# Patient Record
Sex: Male | Born: 1975 | Hispanic: No | Marital: Married | Smoking: Never smoker
Health system: Southern US, Community
[De-identification: ages and names within clinical notes are randomized; demographics above are authoritative.]

## PROBLEM LIST (undated history)

## (undated) DIAGNOSIS — E785 Hyperlipidemia, unspecified: Secondary | ICD-10-CM

## (undated) HISTORY — DX: Hyperlipidemia, unspecified: E78.5

---
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
## 2020-01-29 ENCOUNTER — Ambulatory Visit: Payer: Self-pay | Admitting: Family Medicine

## 2020-02-19 ENCOUNTER — Ambulatory Visit: Payer: Self-pay | Admitting: Family Medicine

## 2020-03-10 ENCOUNTER — Other Ambulatory Visit: Payer: Self-pay

## 2020-03-10 ENCOUNTER — Encounter: Payer: Self-pay | Admitting: Family Medicine

## 2020-03-10 ENCOUNTER — Ambulatory Visit: Payer: PRIVATE HEALTH INSURANCE | Admitting: Family Medicine

## 2020-03-10 VITALS — BP 118/70 | HR 75 | Temp 98.3°F | Ht 69.0 in | Wt 216.1 lb

## 2020-03-10 DIAGNOSIS — E785 Hyperlipidemia, unspecified: Secondary | ICD-10-CM

## 2020-03-10 DIAGNOSIS — E876 Hypokalemia: Secondary | ICD-10-CM | POA: Diagnosis not present

## 2020-03-10 DIAGNOSIS — M79605 Pain in left leg: Secondary | ICD-10-CM

## 2020-03-10 LAB — LIPID PANEL
Cholesterol: 167 mg/dL (ref 0–200)
HDL: 31.4 mg/dL — ABNORMAL LOW (ref >39.00)
LDL Cholesterol: 108 mg/dL — ABNORMAL HIGH (ref 0–99)
NonHDL: 135.74
Total CHOL/HDL Ratio: 5
Triglycerides: 137 mg/dL (ref 0.0–149.0)
VLDL: 27.4 mg/dL (ref 0.0–40.0)

## 2020-03-10 LAB — BASIC METABOLIC PANEL
BUN: 15 mg/dL (ref 6–23)
CO2: 27 mEq/L (ref 19–32)
Calcium: 9.2 mg/dL (ref 8.4–10.5)
Chloride: 106 mEq/L (ref 96–112)
Creatinine, Ser: 1 mg/dL (ref 0.40–1.50)
GFR: 80.39 mL/min (ref >60.00)
Glucose, Bld: 102 mg/dL — ABNORMAL HIGH (ref 70–99)
Potassium: 3.6 mEq/L (ref 3.5–5.1)
Sodium: 139 mEq/L (ref 135–145)

## 2020-03-10 NOTE — Progress Notes (Signed)
Chief Complaint  Patient presents with   New Patient (Initial Visit)   Leg Pain    left       New Patient Visit SUBJECTIVE: HPI: Derek Potts is an 44 y.o.male who is being seen for establishing care.  LLE pain that gets worse throughout the day. Started 4-5 mo ago, getting worse/more freq. No inj or change in activity. No new footwear. No redness, swelling, bruising, decreased ROM. Sitting seems to make it worse. Located in back of thigh midway down, calf. Standing makes it better. Has not tried taking anything thus far. No numbness, tingling or weakness. Describes the pain as pins/needles.   K of 3.2 3 weeks ago with hematology team. Has not taken supps. Not on meds routinely. Asymptomatic.   Hyperlipidemia Patient presents for hyperlipidemia follow up. Currently being treated with Lipitor 10 mg/d and he has not been taking since running out. He denies myalgias. He is adhering to a healthy diet. Exercise: some walking, cycling The patient is not known to have coexisting coronary artery disease.  Past Medical History:  Diagnosis Date   Hyperlipidemia    History reviewed. No pertinent surgical history. Family History  Problem Relation Age of Onset   Hypertension Father    No Known Allergies  Current Outpatient Medications:    cholecalciferol (VITAMIN D3) 25 MCG (1000 UNIT) tablet, Take 1,000 Units by mouth daily., Disp: , Rfl:   OBJECTIVE: BP 118/70 (BP Location: Left Arm, Patient Position: Sitting, Cuff Size: Normal)    Pulse 75    Temp 98.3 F (36.8 C) (Oral)    Ht 5\' 9"  (1.753 m)    Wt (!) 216 lb 2 oz (98 kg)    SpO2 99%    BMI 31.92 kg/m  General:  well developed, well nourished, in no apparent distress Skin:  no significant moles, warts, or growths Nose:  nares patent, septum midline, mucosa normal Throat/Pharynx:  lips and gingiva without lesion; tongue and uvula midline; non-inflamed pharynx; no exudates or postnasal drainage Lungs:  clear to auscultation,  breath sounds equal bilaterally, no respiratory distress Cardio:  regular rate and rhythm, no LE edema or bruits Musculoskeletal: Knee exam on L neg; no ttp, mild tension on L when he stands on toes; no ttp over low back Neuro:  gait normal, DTR's equal and symmetric Psych: well oriented with normal range of affect and appropriate judgment/insight  ASSESSMENT/PLAN: Pain of left lower extremity  Hypokalemia - Plan: Basic metabolic panel  Hyperlipidemia, unspecified hyperlipidemia type - Plan: Lipid panel  I think this is likely positional. Try to change up sitting posture. His hamstrings and calves are tight, will give stretches/exercises. Heat. If no better in 1 mo, will try PT. 2- F/u on hypoK from heme team. 3- has not been taking Lipitor, will ck lipids today and follow up in 6 mo if he needs to go back on it. April for his CPE if he is doing well.  Patient should return pending above. The patient voiced understanding and agreement to the plan.   Derek Lafauci [place], DO 03/10/20  8:59 AM

## 2020-03-10 NOTE — Patient Instructions (Signed)
Give [place] 2-3 business days to get the results of your labs back.   Heat (pad or rice pillow in microwave) over affected area, 10-15 minutes twice daily.   Let me know in the next month if we aren't turning the corner with your left leg.   Semimembranosus Tendinitis Rehab  It is normal to feel mild stretching, pulling, tightness, or discomfort as you do these exercises, but you should stop right away if you feel sudden pain or your pain gets worse.  Stretching and range of motion exercises These exercises warm up your muscles and joints and improve the movement and flexibility of your thigh. These exercises also help to relieve pain, numbness, and tingling. Exercise A: Hamstring stretch, supine    1. Lie on your back. Loop a belt or towel across the ball of your left / right foot The ball of your foot is on the walking surface, right under your toes. 2. Straighten your left / right knee and slowly pull on the belt to raise your leg. Stop when you feel a gentle stretch behind your left / right knee or thigh. ? Do not allow the knee to bend. ? Keep your other leg flat on the floor. 3. Hold this position for 30 seconds. Repeat 2 times. Complete this exercise 3 times a week. Strengthening exercises These exercises build strength and endurance in your thigh. Endurance is the ability to use your muscles for a long time, even after they get tired. Exercise B: Straight leg raises (hip extensors) 1. Lie on your belly on a bed or a firm surface with a pillow under your hips. 2. Bend your left / right knee so your foot is straight up in the air. 3. Squeeze your buttock muscles and lift your left / right thigh off the bed. Do not let your back arch. 4. Hold this position for 3 seconds. 5. Slowly return to the starting position. Let your muscles relax completely before you do another repetition. Repeat 2 times. Complete this exercise 3 times a week. Exercise C: Bridge (hip extensors)     1. Lie on  your back on a firm surface with your knees bent and your feet flat on the floor. 2. Tighten your buttocks muscles and lift your bottom off the floor until your trunk is level with your thighs. ? You should feel the muscles working in your buttocks and the back of your thighs. If you do not feel these muscles, slide your feet 1-2 inches (2.5-5 cm) farther away from your buttocks. ? Do not arch your back. 3. Hold this position for 3 seconds. 4. Slowly lower your hips to the starting position. 5. Let your buttocks muscles relax completely between repetitions. If this exercise is too easy, try doing it with your arms crossed over your chest. Repeat 2 times. Complete this exercise 3 times a week. Exercise D: Hamstring eccentric, prone 1. Lie on your belly on a bed or on the floor. 2. Start with your legs straight. Cross your legs at the ankles with your left / right leg on top. 3. Using your bottom leg to do the work, bend both knees. 4. Using just your left / right leg alone, slowly lower your leg back down toward the bed. Add a 5 lb weight as told by your health care provider. 5. Let your muscles relax completely between repetitions. Repeat 2 times. Complete this exercise 3 times a week. Exercise E: Squats 1. Stand in front of a table,  with your feet and knees pointing straight ahead. You may rest your hands on the table for balance but not for support. 2. Slowly bend your knees and lower your hips like you are going to sit in a chair. Keep your thighs straight or pointed slightly outward. ? Keep your weight over your heels, not over your toes. ? Keep your lower legs upright so they are parallel with the table legs. ? Do not let your hips go lower than your knees. Stop when your knees are bent to the shape of an upside-down letter "L" (90 degree angle). ? Do not bend lower than told by your health care provider. ? If your knee pain increases, do not bend as low. 3. Hold the squat position 1-2  seconds. 4. Slowly push with your legs to return to standing. Do not use your hands to pull yourself to standing. Repeat 2 times. Complete this exercise 3 times a week. Make sure you discuss any questions you have with your health care provider. Document Released: 07/26/2005 Document Revised: 04/01/2016 Document Reviewed: 04/29/2015 Elsevier Interactive Patient Education  2018 [employer].  Stretching and range of motion exercises These exercises warm up your muscles and joints and improve the movement and flexibility of your lower leg. These exercises also help to relieve pain and stiffness.  Exercise A: Gastrocnemius stretch 1. Sit with your left / right leg extended. 2. Loop a belt or towel around the ball of your left / right foot. The ball of your foot is on the walking surface, right under your toes. 3. Hold both ends of the belt or towel. 4. Keep your left / right ankle and foot relaxed and keep your knee straight while you use the belt or towel to pull your foot and ankle toward you. Stop at the first point of resistance. 5. Hold this position for 30 seconds. Repeat 2 times. Complete this exercise 3 times per week.  Exercise B: Ankle alphabet 1. Sit with your left / right leg supported at the lower leg. ? Do not rest your foot on anything. ? Make sure your foot has room to move freely. 2. Think of your left / right foot as a paintbrush, and move your foot to trace each letter of the alphabet in the air. Keep your hip and knee still while you trace. 3. Trace every letter from A to Z. Repeat 2 times. Complete this exercise 3 times per week.  Strengthening exercises These exercises build strength and endurance in your lower leg. Endurance is the ability to use your muscles for a long time, even after they get tired.  Exercise C: Plantar flexors with band 1. Sit with your left / right leg extended. 2. Loop a rubber exercise band or tube around the ball of your left / right foot.  The ball of your foot is on the walking surface, right under your toes. 3. While holding both ends of the band or tube, slowly point your toes downward, pushing them away from you. 4. Hold this position for 3 seconds. 5. Slowly return your foot to the starting position and repeat for a total of 10 repetitions. Repeat 2 times. Complete this exercise 3 times per week.  Exercise D: Plantar flexors, standing 1. Stand with your feet shoulder-width apart. 2. Place your hands on a wall or table to steady yourself as needed, but try not to use it very much for support. 3. Rise up on your toes. 4. If this exercise is  too easy, try these options: ? Shift your weight toward your left / right leg until you feel challenged. ? If told by your health care provider, stand on your left / right foot only. 5. Hold this position for 3 seconds. 6. Repeat for a total of 10 repetitions. Repeat 2 times. Complete this exercise 3 times per week.  Exercise E: Plantar flexors, eccentric 1. Stand on the balls of your feet on the edge of a step. The ball of your foot is on the walking surface, right under your toes. 2. Place your hands on a wall or railing for balance as needed, but try not to lean on it for support. 3. Rise up on your toes, using both legs to help. 4. Slowly shift all of your weight to your left / right foot and lift your other foot off the step. 5. Slowly lower your left / right heel so it drops below the level of the step. You will feel a slight stretch in your left / right calf. 6. Put your other foot back onto the step. Repeat 2 times. Complete this exercise 3 times per week. This information is not intended to replace advice given to you by your health care provider. Make sure you discuss any questions you have with your health care provider.

## 2020-11-26 ENCOUNTER — Encounter: Payer: PRIVATE HEALTH INSURANCE | Admitting: Family Medicine

## 2020-12-22 ENCOUNTER — Ambulatory Visit (INDEPENDENT_AMBULATORY_CARE_PROVIDER_SITE_OTHER): Payer: PRIVATE HEALTH INSURANCE | Admitting: Family Medicine

## 2020-12-22 ENCOUNTER — Encounter: Payer: Self-pay | Admitting: Family Medicine

## 2020-12-22 ENCOUNTER — Other Ambulatory Visit: Payer: Self-pay

## 2020-12-22 VITALS — BP 138/86 | HR 78 | Temp 98.3°F | Ht 70.0 in | Wt 212.0 lb

## 2020-12-22 DIAGNOSIS — Z0000 Encounter for general adult medical examination without abnormal findings: Secondary | ICD-10-CM | POA: Diagnosis not present

## 2020-12-22 DIAGNOSIS — Z1159 Encounter for screening for other viral diseases: Secondary | ICD-10-CM

## 2020-12-22 DIAGNOSIS — Z23 Encounter for immunization: Secondary | ICD-10-CM | POA: Diagnosis not present

## 2020-12-22 DIAGNOSIS — M79605 Pain in left leg: Secondary | ICD-10-CM

## 2020-12-22 LAB — LIPID PANEL
Cholesterol: 175 mg/dL (ref 0–200)
HDL: 33.6 mg/dL — ABNORMAL LOW (ref >39.00)
LDL Cholesterol: 116 mg/dL — ABNORMAL HIGH (ref 0–99)
NonHDL: 141.36
Total CHOL/HDL Ratio: 5
Triglycerides: 127 mg/dL (ref 0.0–149.0)
VLDL: 25.4 mg/dL (ref 0.0–40.0)

## 2020-12-22 LAB — COMPREHENSIVE METABOLIC PANEL
ALT: 18 U/L (ref 0–53)
AST: 21 U/L (ref 0–37)
Albumin: 4.4 g/dL (ref 3.5–5.2)
Alkaline Phosphatase: 65 U/L (ref 39–117)
BUN: 14 mg/dL (ref 6–23)
CO2: 27 mEq/L (ref 19–32)
Calcium: 9.1 mg/dL (ref 8.4–10.5)
Chloride: 107 mEq/L (ref 96–112)
Creatinine, Ser: 0.95 mg/dL (ref 0.40–1.50)
GFR: 95.7 mL/min (ref >60.00)
Glucose, Bld: 97 mg/dL (ref 70–99)
Potassium: 3.7 mEq/L (ref 3.5–5.1)
Sodium: 141 mEq/L (ref 135–145)
Total Bilirubin: 0.8 mg/dL (ref 0.2–1.2)
Total Protein: 6.9 g/dL (ref 6.0–8.3)

## 2020-12-22 LAB — CBC
HCT: 40.2 % (ref 39.0–52.0)
Hemoglobin: 13.7 g/dL (ref 13.0–17.0)
MCHC: 34.1 g/dL (ref 30.0–36.0)
MCV: 88.4 fl (ref 78.0–100.0)
Platelets: 169 10*3/uL (ref 150.0–400.0)
RBC: 4.55 Mil/uL (ref 4.22–5.81)
RDW: 13.6 % (ref 11.5–15.5)
WBC: 3.8 10*3/uL — ABNORMAL LOW (ref 4.0–10.5)

## 2020-12-22 NOTE — Progress Notes (Signed)
Chief Complaint  Patient presents with  . Annual Exam    Cpe    [name] is here for a complete physical.   His last physical was >1 year ago.  Current diet: in general, a "healthy" diet.   Current exercise: walking, active in the yard Weight trend: stable Fatigue out of ordinary? No. Seat belt? Yes.   Loss of interested in doing things or depression in past 2 weeks? No  Health maintenance Tetanus- No HIV- Yes Hep C- No  Past Medical History:  Diagnosis Date  . Hyperlipidemia      History reviewed. No pertinent surgical history.  Medications  Current Outpatient Medications on File Prior to Visit  Medication Sig Dispense Refill  . cholecalciferol (VITAMIN D3) 25 MCG (1000 UNIT) tablet Take 1,000 Units by mouth daily.     Allergies No Known Allergies  Family History Family History  Problem Relation Age of Onset  . Hypertension Father     Review of Systems: Constitutional: no fevers or chills Eye:  no recent significant change in vision Ear/Nose/Mouth/Throat:  Ears:  no hearing loss Nose/Mouth/Throat:  no complaints of nasal congestion, no sore throat Cardiovascular:  no chest pain Respiratory:  no shortness of breath Gastrointestinal:  no abdominal pain, no change in bowel habits GU:  Male: negative for dysuria, frequency, and incontinence Musculoskeletal/Extremities:  no new pain of the joints Integumentary (Skin/Breast):  no abnormal skin lesions reported Neurologic:  no headaches Endocrine: No unexpected weight changes Hematologic/Lymphatic:  no night sweats  Exam BP 138/86   Pulse 78   Temp 98.3 F (36.8 C) (Oral)   Ht 5\' 10"  (1.778 m)   Wt 212 lb (96.2 kg)   SpO2 97%   BMI 30.42 kg/m  General:  well developed, well nourished, in no apparent distress Skin:  no significant moles, warts, or growths Head:  no masses, lesions, or tenderness Eyes:  pupils equal and round, sclera anicteric without injection Ears:  canals without lesions,  TMs shiny without retraction, no obvious effusion, no erythema Nose:  nares patent, septum midline, mucosa normal Throat/Pharynx:  lips and gingiva without lesion; tongue and uvula midline; non-inflamed pharynx; no exudates or postnasal drainage Neck: neck supple without adenopathy, thyromegaly, or masses Lungs:  clear to auscultation, breath sounds equal bilaterally, no respiratory distress Cardio:  regular rate and rhythm, no bruits, no LE edema Abdomen:  abdomen soft, nontender; bowel sounds normal; no masses or organomegaly Rectal: Deferred Musculoskeletal: +tightness of b/l hamstrings, worse on L; symmetrical muscle groups noted without atrophy or deformity Extremities:  no clubbing, cyanosis, or edema, no deformities, no skin discoloration Neuro:  gait normal; deep tendon reflexes normal and symmetric Psych: well oriented with normal range of affect and appropriate judgment/insight  Assessment and Plan  Well adult exam - Plan: CBC, Comprehensive metabolic panel, Lipid panel, Tdap vaccine greater than or equal to 7yo IM  Encounter for hepatitis C screening test for low risk patient - Plan: Hepatitis C antibody  Pain of left lower extremity - Plan: Ambulatory referral to Sports Medicine  Need for Tdap vaccination - Plan: Tdap vaccine greater than or equal to 7yo IM   Well 45 y.o. male. Counseled on diet and exercise. Counseled on risks and benefits of prostate cancer screening with PSA. The patient agrees to forego screening. He follows w urology.  Hamstring issue no better, will refer to sports med.  Tdap today.  Other orders as above. Follow up in 1 yr pending the above  workup. The patient voiced understanding and agreement to the plan.  Derek Potts [place], DO 12/22/20 8:35 AM

## 2020-12-22 NOTE — Patient Instructions (Signed)
Give us 2-3 business days to get the results of your labs back.   Keep the diet clean and stay active.  If you do not hear anything about your referral in the next 1-2 weeks, call our office and ask for an update.  Let us know if you need anything. 

## 2020-12-23 LAB — HEPATITIS C ANTIBODY
Hepatitis C Ab: NONREACTIVE
SIGNAL TO CUT-OFF: 0.01 (ref <1.00)

## 2021-01-06 ENCOUNTER — Ambulatory Visit: Payer: PRIVATE HEALTH INSURANCE | Admitting: Family Medicine

## 2021-01-06 ENCOUNTER — Encounter: Payer: Self-pay | Admitting: Family Medicine

## 2021-01-06 ENCOUNTER — Other Ambulatory Visit: Payer: Self-pay

## 2021-01-06 ENCOUNTER — Ambulatory Visit (HOSPITAL_BASED_OUTPATIENT_CLINIC_OR_DEPARTMENT_OTHER)
Admission: RE | Admit: 2021-01-06 | Discharge: 2021-01-06 | Disposition: A | Discharge status: Home / Self Care | Payer: PRIVATE HEALTH INSURANCE | Source: Ambulatory Visit | Attending: Family Medicine | Admitting: Family Medicine

## 2021-01-06 VITALS — BP 132/90 | Ht 70.0 in | Wt 212.0 lb

## 2021-01-06 DIAGNOSIS — M5416 Radiculopathy, lumbar region: Secondary | ICD-10-CM | POA: Insufficient documentation

## 2021-01-06 MED ORDER — PREDNISONE 5 MG PO TABS: ORAL_TABLET | ORAL | 0 refills | Status: DC

## 2021-01-06 NOTE — Progress Notes (Signed)
  Derek Potts - 45 y.o. male MRN [id number]  Date of birth: 1975-11-30  SUBJECTIVE:  Including CC & ROS.  No chief complaint on file.   Derek Potts is a 45 y.o. male that is presenting with left lateral leg pain is been ongoing for 1 year.  No injury or inciting event.  Feels like the pain is worsening.  Has not tried any medications.  No history of similar pain.   Review of Systems See HPI   HISTORY: Past Medical, Surgical, Social, and Family History Reviewed & Updated per EMR.   Pertinent Historical Findings include:  Past Medical History:  Diagnosis Date  . Hyperlipidemia     History reviewed. No pertinent surgical history.  Family History  Problem Relation Age of Onset  . Hypertension Father     Social History   Socioeconomic History  . Marital status: Married    Spouse name: Not on file  . Number of children: Not on file  . Years of education: Not on file  . Highest education level: Not on file  Occupational History  . Not on file  Tobacco Use  . Smoking status: Never Smoker  . Smokeless tobacco: Never Used  Substance and Sexual Activity  . Alcohol use: Never  . Drug use: Never  . Sexual activity: Not on file  Other Topics Concern  . Not on file  Social History Narrative  . Not on file   Social Determinants of Health   Financial Resource Strain: Not on file  Food Insecurity: Not on file  Transportation Needs: Not on file  Physical Activity: Not on file  Stress: Not on file  Social Connections: Not on file  Intimate Partner Violence: Not on file     PHYSICAL EXAM:  VS: BP 132/90 (BP Location: Left Arm, Patient Position: Sitting, Cuff Size: Large)   Ht 5\' 10"  (1.778 m)   Wt 212 lb (96.2 kg)   BMI 30.42 kg/m  Physical Exam Gen: NAD, alert, cooperative with exam, well-appearing MSK:  Left leg: Normal range of motion. Strength resistance. Instability with hip flexion and abduction on the left. Neurovascularly intact     ASSESSMENT & PLAN:    Lumbar radiculopathy Symptoms seem most consistent with radicular pain.  Has been ongoing for over a year.   -Counseled on home exercise therapy and supportive care. -X-ray. -Prednisone. -referral to physical therapy. -Could consider further imaging or gabapentin.

## 2021-01-06 NOTE — Assessment & Plan Note (Signed)
Symptoms seem most consistent with radicular pain.  Has been ongoing for over a year.   -Counseled on home exercise therapy and supportive care. -X-ray. -Prednisone. -referral to physical therapy. -Could consider further imaging or gabapentin.

## 2021-01-06 NOTE — Patient Instructions (Signed)
Nice to meet you Please try the exercises  I will call with the results.  Please send me a message in MyChart with any questions or updates.  Please see me back in 4 weeks.   --Dr. Manaal Mandala  

## 2021-01-08 ENCOUNTER — Telehealth: Payer: Self-pay | Admitting: Family Medicine

## 2021-01-08 NOTE — Telephone Encounter (Signed)
Informed of results.   Myra Rude, MD [facility] 01/08/2021, 8:19 AM

## 2021-02-04 ENCOUNTER — Ambulatory Visit: Payer: PRIVATE HEALTH INSURANCE | Admitting: Family Medicine

## 2021-03-05 ENCOUNTER — Ambulatory Visit: Payer: Self-pay | Admitting: Family Medicine

## 2021-06-15 ENCOUNTER — Encounter (HOSPITAL_BASED_OUTPATIENT_CLINIC_OR_DEPARTMENT_OTHER): Payer: Self-pay | Admitting: Physical Therapy

## 2021-06-15 ENCOUNTER — Other Ambulatory Visit: Payer: Self-pay

## 2021-06-15 ENCOUNTER — Encounter (HOSPITAL_BASED_OUTPATIENT_CLINIC_OR_DEPARTMENT_OTHER): Payer: No Typology Code available for payment source | Attending: Family Medicine | Admitting: Physical Therapy

## 2021-06-15 DIAGNOSIS — G8929 Other chronic pain: Secondary | ICD-10-CM | POA: Diagnosis present

## 2021-06-15 DIAGNOSIS — M5442 Lumbago with sciatica, left side: Secondary | ICD-10-CM | POA: Diagnosis present

## 2021-06-15 DIAGNOSIS — M5416 Radiculopathy, lumbar region: Secondary | ICD-10-CM | POA: Diagnosis not present

## 2021-06-15 NOTE — Therapy (Signed)
OUTPATIENT PHYSICAL THERAPY THORACOLUMBAR EVALUATION   Patient Name: Derek Potts MRN: [id number] DOB:03/07/1976, 45 y.o., male Today's Date: 06/16/2021   PT End of Session - 06/15/21 0858     Visit Number 1    Number of Visits 12    Date for PT Re-Evaluation 07/27/21    Authorization Type medcost    PT Start Time 0845    PT Stop Time 0928    PT Time Calculation (min) 43 min    Activity Tolerance Patient tolerated treatment well    Behavior During Therapy [facility] for tasks assessed/performed             Past Medical History:  Diagnosis Date   Hyperlipidemia    History reviewed. No pertinent surgical history. Patient Active Problem List   Diagnosis Date Noted   Lumbar radiculopathy 01/06/2021    PCP: Sharlene Dory, DO  REFERRING PROVIDER: Myra Rude, MD  REFERRING DIAG: M54.16 (ICD-10-CM) - Lumbar radiculopathy  THERAPY DIAG:  M54.16 (ICD-10-CM) - Lumbar radiculopathy  ONSET DATE: 1 year but increased pain over the past few months  SUBJECTIVE:                                                                                                                                                                                           SUBJECTIVE STATEMENT: Patient had an insidious onset of low back pain beginning about a year ago. It has gotten worse over the past few months. He has pain down his left leg. It is worst at night and in the morning. He had no past history of lower back pain.  PERTINENT HISTORY:  Nothing significant   PAIN:  Are you having pain? Yes VAS scale: 8/10  at worst.  Pain location: left low back and left sciatic pain  Pain orientation: Left  PAIN TYPE: aching Pain description: constant  Aggravating factors: sitting; night time Relieving factors: rest   PRECAUTIONS: None  WEIGHT BEARING RESTRICTIONS No   FALLS:  Has patient fallen in last 6 months? 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   PLOF: Independent  Hobbies: cycling   PATIENT GOALS   To have less pain    OBJECTIVE:   DIAGNOSTIC FINDINGS:  MRI S1 Nerve root impingement   PATIENT SURVEYS:  FOTO    SCREENING FOR RED FLAGS: Bowel or bladder incontinence: No Spinal tumors: No Cauda equina syndrome: No Compression fracture: No Abdominal aneurysm: No  COGNITION:  Overall cognitive status: Within functional limits for tasks assessed     SENSATION:  Light touch:  Appears intact  Stereognosis: Appears intact  Hot/Cold: Appears intact  Proprioception: Appears intact Pain radiates down the left leg into the left calf   MUSCLE LENGTH: Limited hamstring length bilateral  POSTURE:  Good posture   LUMBARAROM/PROM  A/PROM A/PROM  06/16/2021  Flexion 30 with pain   Extension Full    Right lateral flexion   Left lateral flexion Left side glide painful    Right rotation WNL  Left rotation WNL   (Blank rows = not tested)  LE AROM/PROM:  Pain with left hip flexion    (Blank rows = not tested)  LE MMT:  MMT Right 06/16/2021 Left 06/16/2021  Hip flexion 25 26.5  Hip extension    Hip abduction 31 35  Hip adduction    Hip internal rotation    Hip external rotation    Knee flexion    Knee extension 45.1 30  Ankle dorsiflexion    Ankle plantarflexion    Ankle inversion    Ankle eversion     (Blank rows = not tested)  LUMBAR SPECIAL TESTS:  Slump test not perfroemd 2nd to patient already having radicular symptoms.   SPINAL SEGMENTAL MOBILITY ASSESSMENT:  Limited L4-L5 PA mobility   Palapation :  Spasming of the lower left lumbar spine   GAIT: Decreased left hip flexion and decreased hip rotation with gait   TODAY'S TREATMENT  Reviewed anatomy of condition   Access Code: [facility] URL: [email or web address] Date: 06/15/2021 Prepared by: Lorayne Bender  Exercises Seated Hamstring Stretch - 1 x daily - 7 x weekly -  3 sets - 10 reps Static Prone on Elbows - 1 x daily - 7 x weekly - 3 sets - 10 reps Standing Glute Med Mobilization with Small Ball on Wall - 1 x daily - 7 x weekly - 1 reps - 1 min hold   PATIENT EDUCATION:  Education details: HEP, symptom management;  Person educated: Patient Education method: Explanation Education comprehension: verbalized understanding   HOME EXERCISE PROGRAM: Access Code: [email or web address] URL: [email or web address] Date: 06/15/2021 Prepared by: Lorayne Bender  Exercises Seated Hamstring Stretch - 1 x daily - 7 x weekly - 3 sets - 10 reps Static Prone on Elbows - 1 x daily - 7 x weekly - 3 sets - 10 reps Standing Glute Med Mobilization with Small Ball on Wall - 1 x daily - 7 x weekly - 1 reps - 1 min hold   ASSESSMENT:  CLINICAL IMPRESSION: Patient is a  45 year old ale with an insidious onset of lower back pain and left radicular pain. Signs and symptoms as well as MRI are consistent with lumbar disc bulge. His MRI shows a S1 nerve root impingement. He has radicular pain into his posterior left leg and into the calf. He has a significant limitation in lumbar flexion. He has pain with left hip flexion. He has the most pain when he is sitting. He would benefit from skilled therapy to reduce inflammation and compression on his nerve, and to allow him to get back to cycling and sitting for meetings at work.  decreased activity tolerance, decreased mobility, decreased ROM, decreased strength, impaired sensation, and pain  cleaning, community activity, driving, meal prep, and yard work  Profession  REHAB POTENTIAL: Excellent  CLINICAL DECISION MAKING: Evolving/moderate complexity increasing radicular symptoms   EVALUATION COMPLEXITY: Moderate   GOALS: Goals reviewed with patient? Yes  SHORT TERM GOALS:  STG Name Target Date Goal status  1 Patient will increase lumbar flexion by 20  degrees without radicular pain  Baseline:  07/07/2021 INITIAL  2  Patient will increase left knee extension strength by 10 lbs  Baseline:  07/07/2021 INITIAL  3 Patient will report centralized lower back pain with a 75% recution in radicular symptoms down his leg  Baseline: 07/07/2021 INITIAL  LONG TERM GOALS:   LTG Name Target Date Goal status  1 Patient will sit for > 2 hours at work without increased pain in order to go to meetings at work Baseline: 07/28/2021 INITIAL  2 Patient will be independent with a complete program for strengthening and stretching  Baseline: 07/28/2021 INITIAL  3 Patient will return to cycling without increased pain  Baseline: 07/28/2021 INITIAL  PLAN: PT FREQUENCY: 2x/week  PT DURATION: 6 weeks  PLANNED INTERVENTIONS: Therapeutic exercises, Therapeutic activity, Neuro Muscular re-education, Balance training, Gait training, Patient/Family education, Joint mobilization, Aquatic Therapy, Dry Needling, Electrical stimulation, Spinal mobilization, Cryotherapy, Moist heat, Taping, Traction, Ultrasound, and Manual therapy  PLAN FOR NEXT SESSION: consider LAD; assess hip mobility; work on [employer] progression; manual therapy to lumbar spine; begin basic core exercises.    Dessie Coma PT DPT  06/16/2021, 1:08 PM

## 2021-06-18 ENCOUNTER — Ambulatory Visit (HOSPITAL_BASED_OUTPATIENT_CLINIC_OR_DEPARTMENT_OTHER): Payer: No Typology Code available for payment source | Admitting: Physical Therapy

## 2021-06-23 ENCOUNTER — Ambulatory Visit (HOSPITAL_BASED_OUTPATIENT_CLINIC_OR_DEPARTMENT_OTHER): Payer: No Typology Code available for payment source | Attending: Family Medicine | Admitting: Physical Therapy

## 2021-06-23 ENCOUNTER — Encounter (HOSPITAL_BASED_OUTPATIENT_CLINIC_OR_DEPARTMENT_OTHER): Payer: Self-pay | Admitting: Physical Therapy

## 2021-06-23 ENCOUNTER — Other Ambulatory Visit: Payer: Self-pay

## 2021-06-23 DIAGNOSIS — G8929 Other chronic pain: Secondary | ICD-10-CM | POA: Insufficient documentation

## 2021-06-23 DIAGNOSIS — M5442 Lumbago with sciatica, left side: Secondary | ICD-10-CM | POA: Diagnosis present

## 2021-06-23 DIAGNOSIS — M5416 Radiculopathy, lumbar region: Secondary | ICD-10-CM | POA: Insufficient documentation

## 2021-06-23 NOTE — Therapy (Signed)
OUTPATIENT PHYSICAL THERAPY TREATMENT NOTE   Patient Name: Derek Potts MRN: [id number] DOB:[birth year], 45 y.o., male Today's Date: 06/23/2021  PCP: Sharlene Dory, DO REFERRING PROVIDER: Myra Rude, MD   PT End of Session - 06/23/21 1409     Visit Number 2    Number of Visits 12    Date for PT Re-Evaluation 07/27/21    Authorization Type medcost    PT Start Time 0845    PT Stop Time 0927    PT Time Calculation (min) 42 min    Activity Tolerance Patient tolerated treatment well    Behavior During Therapy [facility] for tasks assessed/performed             Past Medical History:  Diagnosis Date   Hyperlipidemia    History reviewed. No pertinent surgical history. Patient Active Problem List   Diagnosis Date Noted   Lumbar radiculopathy 01/06/2021   PCP: Sharlene Dory, DO   REFERRING PROVIDER: Myra Rude, MD   REFERRING DIAG: [phone] (ICD-10-CM) - Lumbar radiculopathy   THERAPY DIAG:  M54.16 (ICD-10-CM) - Lumbar radiculopathy   ONSET DATE: 1 year but increased pain over the past few months   SUBJECTIVE:                                                                                                                                                                                            SUBJECTIVE STATEMENT: Patient reports the pain in his back has improved but he continues to have significant pain in his calf and his foot. He feels the prone exercises have helped. He has been able to do the hamstring stretch with his left but with the right he has increased symptoms.   PERTINENT HISTORY:  Nothing significant    PAIN:  Are you having pain? Yes VAS scale: 8/10  at worst. Pain on average lower but still reaching high levels in the morning  Pain location: left low back and left sciatic pain  Pain orientation: Left  PAIN TYPE: aching Pain description: constant  Aggravating factors: sitting; night time Relieving factors: rest    TODAY'S  TREATMENT  Reviewed anatomy of condition       Exercises  Static Prone on Elbows 1 min  Prone press-up: able to go through the full range 3x10; improved pain in his foot and calf noted  Supine clamshell with abdominal breathing 3x10 green  Standing shoulder extension green 3x10 Scap retraction 3x10 green  Standing extension with self stabilization 3x10   Manual: trigger point release to lower lumbar spine and upper gluteal focused on the left: LAD grade III and IV  occialtions bilateral.           PATIENT EDUCATION:  Education details: HEP, symptom management;  Person educated: Patient Education method: Explanation Education comprehension: verbalized understanding     HOME EXERCISE PROGRAM: Access Code: [email or web address] URL: [email or web address] Date: 06/15/2021 Prepared by: Lorayne Bender   Exercises Seated Hamstring Stretch - 1 x daily - 7 x weekly - 3 sets - 10 reps Static Prone on Elbows - 1 x daily - 7 x weekly - 3 sets - 10 reps Standing Glute Med Mobilization with Small Ball on Wall - 1 x daily - 7 x weekly - 1 reps - 1 min hold     ASSESSMENT:   CLINICAL IMPRESSION: Patient is making some forward progress. His low back pain is improving. We would like for the foot and calf pain to be improving first though.  He does feel like he can control it more, but it is just as severe in the morning. He will be going to [place] on Thursday. He was advised to use his extension and stretches. Therapy added baseline core strengthening today. He had no increase in symptoms. We will continue to progress as tolerated.  cleaning, community activity, driving, meal prep, and yard work   Profession   REHAB POTENTIAL: Excellent   CLINICAL DECISION MAKING: Evolving/moderate complexity increasing radicular symptoms    EVALUATION COMPLEXITY: Moderate     GOALS: Goals reviewed with patient? Yes   SHORT TERM GOALS:   STG Name Target Date Goal status  1 Patient will  increase lumbar flexion by 20 degrees without radicular pain  Baseline:  07/07/2021 INITIAL  2 Patient will increase left knee extension strength by 10 lbs  Baseline:  07/07/2021 INITIAL  3 Patient will report centralized lower back pain with a 75% recution in radicular symptoms down his leg  Baseline: 07/07/2021 INITIAL  LONG TERM GOALS:    LTG Name Target Date Goal status  1 Patient will sit for > 2 hours at work without increased pain in order to go to meetings at work Baseline: 07/28/2021 INITIAL  2 Patient will be independent with a complete program for strengthening and stretching  Baseline: 07/28/2021 INITIAL  3 Patient will return to cycling without increased pain  Baseline: 07/28/2021 INITIAL  PLAN: PT FREQUENCY: 2x/week   PT DURATION: 6 weeks   PLANNED INTERVENTIONS: Therapeutic exercises, Therapeutic activity, Neuro Muscular re-education, Balance training, Gait training, Patient/Family education, Joint mobilization, Aquatic Therapy, Dry Needling, Electrical stimulation, Spinal mobilization, Cryotherapy, Moist heat, Taping, Traction, Ultrasound, and Manual therapy   PLAN FOR NEXT SESSION: consider LAD; assess hip mobility; work on [employer] progression; manual therapy to lumbar spine; begin basic core exercises.       Dessie Coma PT DPT  06/23/2021, 2:11 PM

## 2021-06-30 ENCOUNTER — Ambulatory Visit (HOSPITAL_BASED_OUTPATIENT_CLINIC_OR_DEPARTMENT_OTHER): Payer: No Typology Code available for payment source | Admitting: Physical Therapy

## 2021-06-30 ENCOUNTER — Other Ambulatory Visit: Payer: Self-pay

## 2021-06-30 DIAGNOSIS — M5416 Radiculopathy, lumbar region: Secondary | ICD-10-CM

## 2021-06-30 DIAGNOSIS — G8929 Other chronic pain: Secondary | ICD-10-CM

## 2021-06-30 NOTE — Therapy (Signed)
OUTPATIENT PHYSICAL THERAPY TREATMENT NOTE   Patient Name: Derek Potts MRN: [id number] DOB:April 07, 1976, 45 y.o., male Today's Date: 06/30/2021  PCP: Sharlene Dory, DO REFERRING PROVIDER: Sharlene Dory*    Past Medical History:  Diagnosis Date   Hyperlipidemia    No past surgical history on file. Patient Active Problem List   Diagnosis Date Noted   Lumbar radiculopathy 01/06/2021     PCP: Sharlene Dory, DO   REFERRING PROVIDER: Myra Rude, MD   REFERRING DIAG: [phone] (ICD-10-CM) - Lumbar radiculopathy   THERAPY DIAG:  M54.16 (ICD-10-CM) - Lumbar radiculopathy   ONSET DATE: 1 year but increased pain over the past few months   SUBJECTIVE:                                                                                                                                                                                            SUBJECTIVE STATEMENT: Patient feels like it is improving, but driving last week he had significant pain down his leg. He was able to make it abut 2.5 hours before he had to pull over and stretch. On the way back he was bale to do his stretches and exercises so the pain wasn't as bad.    PERTINENT HISTORY:  Nothing significant    PAIN:  Are you having pain? Yes VAS scale: 5/10  at worst. Pain on average lower but still reaching high levels with driving  Pain location: left low back and left sciatic pain  Pain orientation: Left  PAIN TYPE: aching Pain description: constant  Aggravating factors: sitting; night time Relieving factors: rest   radiates into the left lower leg.  TODAY'S TREATMENT  Reviewed anatomy of condition      11/22  Manual: trigger point release to lower lumbar spine and upper gluteal focused on the left: LAD grade III and IV occialtions bilateral.  Talked with patient about dry needling of the paraspinal. Patient will think about it.   Prone press-up: able to go through the full range 3x10;  i Shoulder extension machine 3x10 15 lbs  Row machine 3x10 with cuing for abdominal breathing 3x15  Triceps extension with abdominal brace 3x10 15 lbs   11/15  Exercises    Static Prone on Elbows 1 min  Prone press-up: able to go through the full range 3x10; improved pain in his foot and calf noted  Supine clamshell with abdominal breathing 3x10 green  Standing shoulder extension green 3x10 Scap retraction 3x10 green  Standing extension with self stabilization 3x10    Manual: trigger point release to lower lumbar spine and upper gluteal focused on the  left: LAD grade III and IV occialtions bilateral.                PATIENT EDUCATION:  Education details: HEP, symptom management;  Person educated: Patient Education method: Explanation Education comprehension: verbalized understanding     HOME EXERCISE PROGRAM: Access Code: [email or web address] URL: [email or web address] Date: 06/15/2021 Prepared by: Lorayne Bender   Exercises Seated Hamstring Stretch - 1 x daily - 7 x weekly - 3 sets - 10 reps Static Prone on Elbows - 1 x daily - 7 x weekly - 3 sets - 10 reps Standing Glute Med Mobilization with Small Ball on Wall - 1 x daily - 7 x weekly - 1 reps - 1 min hold     ASSESSMENT:   CLINICAL IMPRESSION: Patient continues to make progress. His pain appears to be centralizing. Most of his tightness was in his left lumbar parapsinal. He tolerated soft tissue mobilization well. Therapy advanced him to light cable work for core strengthening. He tolerated well but did report minor tightness with gym activity. He had 1 more visit scheduled. We will follow re-assess plan next visit.   Profession   REHAB POTENTIAL: Excellent   CLINICAL DECISION MAKING: Evolving/moderate complexity increasing radicular symptoms    EVALUATION COMPLEXITY: Moderate     GOALS: Goals reviewed with patient? Yes   SHORT TERM GOALS:   STG Name Target Date Goal status  1 Patient will increase  lumbar flexion by 20 degrees without radicular pain  Baseline:  07/07/2021 INITIAL  2 Patient will increase left knee extension strength by 10 lbs  Baseline:  07/07/2021 INITIAL  3 Patient will report centralized lower back pain with a 75% recution in radicular symptoms down his leg  Baseline: 07/07/2021 INITIAL  LONG TERM GOALS:    LTG Name Target Date Goal status  1 Patient will sit for > 2 hours at work without increased pain in order to go to meetings at work Baseline: 07/28/2021 INITIAL  2 Patient will be independent with a complete program for strengthening and stretching  Baseline: 07/28/2021 INITIAL  3 Patient will return to cycling without increased pain  Baseline: 07/28/2021 INITIAL  PLAN: PT FREQUENCY: 2x/week   PT DURATION: 6 weeks   PLANNED INTERVENTIONS: Therapeutic exercises, Therapeutic activity, Neuro Muscular re-education, Balance training, Gait training, Patient/Family education, Joint mobilization, Aquatic Therapy, Dry Needling, Electrical stimulation, Spinal mobilization, Cryotherapy, Moist heat, Taping, Traction, Ultrasound, and Manual therapy   PLAN FOR NEXT SESSION: consider LAD; assess hip mobility; work on [employer] progression; manual therapy to lumbar spine; begin basic core exercises.     Dessie Coma PT DPT  06/30/2021, 9:01 AM

## 2021-07-01 ENCOUNTER — Encounter (HOSPITAL_BASED_OUTPATIENT_CLINIC_OR_DEPARTMENT_OTHER): Payer: Self-pay | Admitting: Physical Therapy

## 2021-07-07 ENCOUNTER — Encounter (HOSPITAL_BASED_OUTPATIENT_CLINIC_OR_DEPARTMENT_OTHER): Payer: Self-pay | Admitting: Physical Therapy

## 2021-07-07 ENCOUNTER — Ambulatory Visit (HOSPITAL_BASED_OUTPATIENT_CLINIC_OR_DEPARTMENT_OTHER): Payer: No Typology Code available for payment source | Admitting: Physical Therapy

## 2021-07-07 ENCOUNTER — Other Ambulatory Visit: Payer: Self-pay

## 2021-07-07 DIAGNOSIS — M5416 Radiculopathy, lumbar region: Secondary | ICD-10-CM

## 2021-07-07 DIAGNOSIS — G8929 Other chronic pain: Secondary | ICD-10-CM

## 2021-07-07 NOTE — Therapy (Signed)
OUTPATIENT PHYSICAL THERAPY TREATMENT NOTE   Patient Name: Derek Potts MRN: [id number] DOB:12-Mar-1976, 45 y.o., male Today's Date: 07/07/2021  PCP: Sharlene Dory, DO REFERRING PROVIDER: Fortune, Brannigan*   PT End of Session - 07/07/21 0910     Visit Number 4    Number of Visits 12    Date for PT Re-Evaluation 07/27/21    Authorization Type medcost    PT Start Time 0847    PT Stop Time 0932    PT Time Calculation (min) 45 min    Activity Tolerance Patient tolerated treatment well    Behavior During Therapy [facility] for tasks assessed/performed             Past Medical History:  Diagnosis Date   Hyperlipidemia    History reviewed. No pertinent surgical history. Patient Active Problem List   Diagnosis Date Noted   Lumbar radiculopathy 01/06/2021   PCP: Sharlene Dory, DO   REFERRING PROVIDER: Myra Rude, MD   REFERRING DIAG: [phone] (ICD-10-CM) - Lumbar radiculopathy   THERAPY DIAG:  M54.16 (ICD-10-CM) - Lumbar radiculopathy   ONSET DATE: 1 year but increased pain over the past few months   SUBJECTIVE:                                                                                                                                                                                            SUBJECTIVE STATEMENT: Patient feels like it is improving, but driving last week he had significant pain down his leg. He was able to make it abut 2.5 hours before he had to pull over and stretch. On the way back he was bale to do his stretches and exercises so the pain wasn't as bad.    PERTINENT HISTORY:  Nothing significant    PAIN:  Are you having pain? Yes VAS scale: 5/10  at worst. Pain on average lower but still reaching high levels with driving  Pain location: left low back and left sciatic pain  Pain orientation: Left  PAIN TYPE: aching Pain description: constant  Aggravating factors: sitting; night time Relieving factors: rest   radiates  into the left lower leg.  TODAY'S TREATMENT  Reviewed anatomy of condition      11/29 Manual: trigger point release to lower lumbar spine and upper gluteal focused on the left: LAD grade III and IV occialtions bilateral.  Trigger point dry needling: consent given, explained procedure: skilled palpation of trigger points and monitoring of symptoms during treatment. Needles:     .30x50  Muscles Needled: Left L3 and L4 paraspinals   Prone press ups 2x15 Bridges 2x10 Supine  march 2x10  Seated clamshell 2x10 Rows: 15 lbs 2x10 Shoulder extension 2x10 10 lbs     11/22  Manual: trigger point release to lower lumbar spine and upper gluteal focused on the left: LAD grade III and IV occialtions bilateral.  Talked with patient about dry needling of the paraspinal. Patient will think about it.    Prone press-up: able to go through the full range 3x10; i Shoulder extension machine 3x10 15 lbs  Row machine 3x10 with cuing for abdominal breathing 3x15  Triceps extension with abdominal brace 3x10 15 lbs    11/15   Exercises     Static Prone on Elbows 1 min  Prone press-up: able to go through the full range 3x10; improved pain in his foot and calf noted  Supine clamshell with abdominal breathing 3x10 green  Standing shoulder extension green 3x10 Scap retraction 3x10 green  Standing extension with self stabilization 3x10    Manual: trigger point release to lower lumbar spine and upper gluteal focused on the left: LAD grade III and IV occialtions bilateral.                PATIENT EDUCATION:  Education details: HEP, symptom management;  Person educated: Patient Education method: Explanation Education comprehension: verbalized understanding     HOME EXERCISE PROGRAM: Access Code: [facility] URL: [email or web address] Date: 06/15/2021 Prepared by: Lorayne Bender   Exercises Seated Hamstring Stretch - 1 x daily - 7 x weekly - 3 sets - 10 reps Static Prone on Elbows -  1 x daily - 7 x weekly - 3 sets - 10 reps Standing Glute Med Mobilization with Small Ball on Wall - 1 x daily - 7 x weekly - 1 reps - 1 min hold     ASSESSMENT:   CLINICAL IMPRESSION: Therapy trialld dry needling today. He had a large trigger point around L3 L4. He had a good twitch response to the needling. Therapy then performed manual therapy to the area. He flet decreased pain with extensions after manual and needling. Therapy reviewed core exercises with the patient.  REHAB POTENTIAL: Excellent   CLINICAL DECISION MAKING: Evolving/moderate complexity increasing radicular symptoms    EVALUATION COMPLEXITY: Moderate     GOALS: Goals reviewed with patient? Yes   SHORT TERM GOALS:   STG Name Target Date Goal status  1 Patient will increase lumbar flexion by 20 degrees without radicular pain  Baseline:  07/07/2021 INITIAL  2 Patient will increase left knee extension strength by 10 lbs  Baseline:  07/07/2021 INITIAL  3 Patient will report centralized lower back pain with a 75% recution in radicular symptoms down his leg  Baseline: 07/07/2021 INITIAL  LONG TERM GOALS:    LTG Name Target Date Goal status  1 Patient will sit for > 2 hours at work without increased pain in order to go to meetings at work Baseline: 07/28/2021 INITIAL  2 Patient will be independent with a complete program for strengthening and stretching  Baseline: 07/28/2021 INITIAL  3 Patient will return to cycling without increased pain  Baseline: 07/28/2021 INITIAL  PLAN: PT FREQUENCY: 2x/week   PT DURATION: 6 weeks   PLANNED INTERVENTIONS: Therapeutic exercises, Therapeutic activity, Neuro Muscular re-education, Balance training, Gait training, Patient/Family education, Joint mobilization, Aquatic Therapy, Dry Needling, Electrical stimulation, Spinal mobilization, Cryotherapy, Moist heat, Taping, Traction, Ultrasound, and Manual therapy   PLAN FOR NEXT SESSION: consider LAD; assess hip mobility; work on  [employer] progression; manual therapy to lumbar spine; begin basic core exercises.  Dessie Coma PT DPT  07/07/2021, 11:41 AM

## 2021-07-24 ENCOUNTER — Other Ambulatory Visit: Payer: Self-pay

## 2021-07-24 ENCOUNTER — Encounter (HOSPITAL_BASED_OUTPATIENT_CLINIC_OR_DEPARTMENT_OTHER): Payer: Self-pay | Admitting: Physical Therapy

## 2021-07-24 ENCOUNTER — Ambulatory Visit (HOSPITAL_BASED_OUTPATIENT_CLINIC_OR_DEPARTMENT_OTHER): Payer: PRIVATE HEALTH INSURANCE | Attending: Family Medicine | Admitting: Physical Therapy

## 2021-07-24 DIAGNOSIS — G8929 Other chronic pain: Secondary | ICD-10-CM | POA: Diagnosis present

## 2021-07-24 DIAGNOSIS — M5416 Radiculopathy, lumbar region: Secondary | ICD-10-CM | POA: Diagnosis present

## 2021-07-24 DIAGNOSIS — M5442 Lumbago with sciatica, left side: Secondary | ICD-10-CM | POA: Insufficient documentation

## 2021-07-24 NOTE — Therapy (Signed)
OUTPATIENT PHYSICAL THERAPY TREATMENT NOTE   Patient Name: Derek Potts MRN: [id number] DOB:1976/06/24, 45 y.o., male Today's Date: 07/24/2021  PCP: Sharlene Dory, DO REFERRING PROVIDER: Elick, Aguilera*   PT End of Session - 07/24/21 0904     Visit Number 5    Number of Visits 12    Date for PT Re-Evaluation 07/27/21    Authorization Type medcost    PT Start Time 0845    PT Stop Time 0928    PT Time Calculation (min) 43 min    Activity Tolerance Patient tolerated treatment well    Behavior During Therapy [facility] for tasks assessed/performed             Past Medical History:  Diagnosis Date   Hyperlipidemia    History reviewed. No pertinent surgical history. Patient Active Problem List   Diagnosis Date Noted   Lumbar radiculopathy 01/06/2021    PCP: Sharlene Dory, DO   REFERRING PROVIDER: Myra Rude, MD   REFERRING DIAG: [phone] (ICD-10-CM) - Lumbar radiculopathy   THERAPY DIAG:  M54.16 (ICD-10-CM) - Lumbar radiculopathy   ONSET DATE: 1 year but increased pain over the past few months   SUBJECTIVE:                                                                                                                                                                                            SUBJECTIVE STATEMENT: Patient feels like it is improving, but driving last week he had significant pain down his leg. He was able to make it abut 2.5 hours before he had to pull over and stretch. On the way back he was bale to do his stretches and exercises so the pain wasn't as bad.    PERTINENT HISTORY:  Nothing significant    PAIN:  Are you having pain? Yes VAS scale: 5/10  when he woke up Pain location: left lower leg   Pain orientation: Left  PAIN TYPE: aching Pain description: constant  Aggravating factors: sitting; night time Relieving factors: rest   radiates into the left lower leg.  TODAY'S TREATMENT  Reviewed anatomy of condition    12/16 Prone press ups 2x15  Prone press ups 2x15 Bridges 2x10 Supine march 2x10  Seated clamshell 2x10 Rows: 15 lbs 2x10 Shoulder extension 2x10 10 lbs  Manual: trigger point release to lower lumbar spine and upper gluteal focused on the left: LAD grade III and IV occialtions bilateral.    11/29 Manual: trigger point release to lower lumbar spine and upper gluteal focused on the left: LAD grade III and IV occialtions bilateral.  Trigger point dry  needling: consent given, explained procedure: skilled palpation of trigger points and monitoring of symptoms during treatment. Needles:     .30x50  Muscles Needled: Left L3 and L4 paraspinals    Prone press ups 2x15 Bridges 2x10 Supine march 2x10  Seated clamshell 2x10 Rows: 15 lbs 2x10 Shoulder extension 2x10 10 lbs        11/22  Manual: trigger point release to lower lumbar spine and upper gluteal focused on the left: LAD grade III and IV occialtions bilateral.  Talked with patient about dry needling of the paraspinal. Patient will think about it.    Prone press-up: able to go through the full range 3x10; i Shoulder extension machine 3x10 15 lbs  Row machine 3x10 with cuing for abdominal breathing 3x15  Triceps extension with abdominal brace 3x10 15 lbs    11/15   Exercises     Static Prone on Elbows 1 min  Prone press-up: able to go through the full range 3x10; improved pain in his foot and calf noted  Supine clamshell with abdominal breathing 3x10 green  Standing shoulder extension green 3x10 Scap retraction 3x10 green  Standing extension with self stabilization 3x10    Manual: trigger point release to lower lumbar spine and upper gluteal focused on the left: LAD grade III and IV occialtions bilateral.                PATIENT EDUCATION:  Education details: HEP, symptom management;  Person educated: Patient Education method: Explanation Education comprehension: verbalized understanding     HOME EXERCISE  PROGRAM: Access Code: [facility] URL: [email or web address] Date: 06/15/2021 Prepared by: Lorayne Bender   Exercises Seated Hamstring Stretch - 1 x daily - 7 x weekly - 3 sets - 10 reps Static Prone on Elbows - 1 x daily - 7 x weekly - 3 sets - 10 reps Standing Glute Med Mobilization with Small Ball on Wall - 1 x daily - 7 x weekly - 1 reps - 1 min hold     ASSESSMENT:   CLINICAL IMPRESSION: At this time the patient was advised to go back to his MD for follow up. His pain is controllable but still reaches a high level in his lower leg in the morning. He can Fabiola Backer it out and for the most part has no pain during the day. The fact that it is still going to the bottom of his leg is concerning. He continues to have a spasm in his left lower lumbar paraspinal. Therapy performed manual therapy on the are and wokred on LAD for decompression. He will continue with PT until follow up.   REHAB POTENTIAL: Excellent   CLINICAL DECISION MAKING: Evolving/moderate complexity increasing radicular symptoms    EVALUATION COMPLEXITY: Moderate     GOALS: Goals reviewed with patient? Yes   SHORT TERM GOALS:   STG Name Target Date Goal status  1 Patient will increase lumbar flexion by 20 degrees without radicular pain  Baseline:  07/07/2021 INITIAL  2 Patient will increase left knee extension strength by 10 lbs  Baseline:  07/07/2021 INITIAL  3 Patient will report centralized lower back pain with a 75% recution in radicular symptoms down his leg  Baseline: 07/07/2021 INITIAL  LONG TERM GOALS:    LTG Name Target Date Goal status  1 Patient will sit for > 2 hours at work without increased pain in order to go to meetings at work Baseline: 07/28/2021 INITIAL  2 Patient will be independent with a complete program for strengthening and  stretching  Baseline: 07/28/2021 INITIAL  3 Patient will return to cycling without increased pain  Baseline: 07/28/2021 INITIAL  PLAN: PT FREQUENCY:  2x/week   PT DURATION: 6 weeks   PLANNED INTERVENTIONS: Therapeutic exercises, Therapeutic activity, Neuro Muscular re-education, Balance training, Gait training, Patient/Family education, Joint mobilization, Aquatic Therapy, Dry Needling, Electrical stimulation, Spinal mobilization, Cryotherapy, Moist heat, Taping, Traction, Ultrasound, and Manual therapy   PLAN FOR NEXT SESSION: consider LAD; assess hip mobility; work on [employer] progression; manual therapy to lumbar spine; begin basic core exercises.      Dessie Coma PT DPT  07/24/2021, 2:24 PM

## 2021-08-11 ENCOUNTER — Encounter (HOSPITAL_BASED_OUTPATIENT_CLINIC_OR_DEPARTMENT_OTHER): Payer: No Typology Code available for payment source | Admitting: Physical Therapy

## 2021-08-26 ENCOUNTER — Ambulatory Visit (HOSPITAL_BASED_OUTPATIENT_CLINIC_OR_DEPARTMENT_OTHER): Payer: PRIVATE HEALTH INSURANCE | Attending: Family Medicine | Admitting: Physical Therapy

## 2021-08-26 ENCOUNTER — Other Ambulatory Visit: Payer: Self-pay

## 2021-08-26 ENCOUNTER — Encounter (HOSPITAL_BASED_OUTPATIENT_CLINIC_OR_DEPARTMENT_OTHER): Payer: Self-pay | Admitting: Physical Therapy

## 2021-08-26 DIAGNOSIS — M5416 Radiculopathy, lumbar region: Secondary | ICD-10-CM | POA: Diagnosis present

## 2021-08-26 DIAGNOSIS — G8929 Other chronic pain: Secondary | ICD-10-CM | POA: Diagnosis present

## 2021-08-26 DIAGNOSIS — M5442 Lumbago with sciatica, left side: Secondary | ICD-10-CM | POA: Diagnosis present

## 2021-08-26 NOTE — Therapy (Signed)
OUTPATIENT PHYSICAL THERAPY TREATMENT NOTE   Patient Name: Derek Potts MRN: [id number] DOB:[birth year], 46 y.o., male Today's Date: 08/26/2021  PCP: Sharlene Dory, DO REFERRING PROVIDER: Varun, Jourdan*   PT End of Session - 08/26/21 0858     Visit Number 6    Number of Visits 12    Date for PT Re-Evaluation 07/27/21    Authorization Type medcost    PT Start Time 0845    PT Stop Time 0927    PT Time Calculation (min) 42 min    Activity Tolerance Patient tolerated treatment well    Behavior During Therapy [facility] for tasks assessed/performed             Past Medical History:  Diagnosis Date   Hyperlipidemia    History reviewed. No pertinent surgical history. Patient Active Problem List   Diagnosis Date Noted   Lumbar radiculopathy 01/06/2021    PCP: Sharlene Dory, DO   REFERRING PROVIDER: Myra Rude, MD   REFERRING DIAG: [phone] (ICD-10-CM) - Lumbar radiculopathy   THERAPY DIAG:  M54.16 (ICD-10-CM) - Lumbar radiculopathy   ONSET DATE: 1 year but increased pain over the past few months   SUBJECTIVE:                                                                                                                                                                                            SUBJECTIVE STATEMENT: Patient has almost no pain during the day/ He wakes up in the morning with significant pain going down the side of his hip and at times into his foot. The area he describes follows the L5 nerve root perfectly. He continues to do his exercises at home.  PERTINENT HISTORY:  Nothing significant    PAIN:  Are you having pain? Yes VAS scale: 5/10  when he woke up Pain location: left lower leg   Pain orientation: Left  PAIN TYPE: aching Pain description: constant  Aggravating factors: sitting; night time Relieving factors: rest   radiates into the left lower leg.  Objective   MMT Right 06/16/2021 Left 06/16/2021  Hip flexion  25 1/18 52.5 26.5 1/18 49    Hip extension      Hip abduction 31 1/18 59.5 35 1/18 65.5  Hip adduction      Hip internal rotation      Hip external rotation      Knee flexion      Knee extension 45.1 1/18 72.3 30 1/18 53.1  Ankle dorsiflexion      Ankle plantarflexion      Ankle inversion      Ankle  eversion       (Blank rows = not tested) FOTO:  47% expected 72% ability in 6 visits  Motion :  Flexion 70 degrees without pain  Full rotation bilateral  Improved pain with extension    TODAY'S TREATMENT  1/18 SLR 2x10 bilateral                Bridges 2x10 Proner press-up 2x10  Reviewed reuslts of tests and measures. Also reviewed L5 nerve root.  LAQ green band 3x15    Manual: trigger point release to lower lumbar spine and upper gluteal focused on the left: LAD grade III and IV occialtions bilateral.    Manual: trigger point release to lower lumbar spine and upper gluteal focused on the left: LAD grade III and IV occialtions bilateral.   12/16 Prone press ups 2x15  Prone press ups 2x15 Bridges 2x10 Supine march 2x10  Seated clamshell 2x10 Rows: 15 lbs 2x10 Shoulder extension 2x10 10 lbs  Manual: trigger point release to lower lumbar spine and upper gluteal focused on the left: LAD grade III and IV occialtions bilateral.    11/29 Manual: trigger point release to lower lumbar spine and upper gluteal focused on the left: LAD grade III and IV occialtions bilateral.  Trigger point dry needling: consent given, explained procedure: skilled palpation of trigger points and monitoring of symptoms during treatment. Needles:     .30x50  Muscles Needled: Left L3 and L4 paraspinals    Prone press ups 2x15 Bridges 2x10 Supine march 2x10  Seated clamshell 2x10 Rows: 15 lbs 2x10 Shoulder extension 2x10 10 lbs                      PATIENT EDUCATION:  Education details: HEP, symptom management;  Person educated: Patient Education method: Explanation Education  comprehension: verbalized understanding     HOME EXERCISE PROGRAM: Access Code: [email or web address] URL: [email or web address] Date: 06/15/2021 Prepared by: Lorayne Bender   Exercises Seated Hamstring Stretch - 1 x daily - 7 x weekly - 3 sets - 10 reps Static Prone on Elbows - 1 x daily - 7 x weekly - 3 sets - 10 reps Standing Glute Med Mobilization with Small Ball on Wall - 1 x daily - 7 x weekly - 1 reps - 1 min hold     ASSESSMENT:   CLINICAL IMPRESSION: Patient is making progress. His strength has improved although his knee extension is limited compared to the right. His FOTO has improved significantly. It is concerning though that he still has high levels of pain in the morning. He is able to work it out with his stretches and his exercises but it comes back with sitting and in bed at night. Therapy gave him exercises to focus on his quad weakness. SLR seemed to reproduce his pain. He was advised improving his ability to do this could improve his pain but if it makes it worse stop. The patient will come back in 2-3 weeks for a follow up. He was advised in the meantime to talk to is MD about a follow up.  REHAB POTENTIAL: Excellent   CLINICAL DECISION MAKING: Evolving/moderate complexity increasing radicular symptoms    EVALUATION COMPLEXITY: Moderate     GOALS: Goals reviewed with patient? Yes   SHORT TERM GOALS:   STG Name Target Date Goal status 1/18  1 Patient will increase lumbar flexion by 20 degrees without radicular pain  Baseline:  07/07/2021 Improved 30 degrees  Achieved   2 Patient will  increase left knee extension strength  to within 5 degrees of the right  07/07/2021 All measures improved by at least 20  Achieved and revised   3 Patient will report centralized lower back pain with a 75% recution in radicular symptoms down his leg  Baseline: 07/07/2021 Continues to have pain into his lower leg and foot. The instances of pain have reduced significantly but  intensity and area is about the same   LONG TERM GOALS:    LTG Name Target Date Goal status  1 Patient will sit for > 2 hours at work without increased pain in order to go to meetings at work Baseline: 07/28/2021 When he sits all day it becomes painful  Ongoing   2 Patient will be independent with a complete program for strengthening and stretching  Baseline: 07/28/2021 Still advancing exercises  3 Patient will return to cycling without increased pain  Baseline: 07/28/2021 Has not returned to cycling  Ongoing   PLAN: PT FREQUENCY: 2x/week   PT DURATION: 6 weeks   PLANNED INTERVENTIONS: Therapeutic exercises, Therapeutic activity, Neuro Muscular re-education, Balance training, Gait training, Patient/Family education, Joint mobilization, Aquatic Therapy, Dry Needling, Electrical stimulation, Spinal mobilization, Cryotherapy, Moist heat, Taping, Traction, Ultrasound, and Manual therapy   PLAN FOR NEXT SESSION: consider LAD; assess hip mobility; work on [employer] progression; manual therapy to lumbar spine; begin basic core exercises.      Dessie Comaavid J Ebon Ketchum PT DPT  08/26/2021, 4:16 PM

## 2021-09-22 ENCOUNTER — Encounter (HOSPITAL_BASED_OUTPATIENT_CLINIC_OR_DEPARTMENT_OTHER): Payer: PRIVATE HEALTH INSURANCE | Admitting: Physical Therapy

## 2021-09-29 ENCOUNTER — Encounter (HOSPITAL_BASED_OUTPATIENT_CLINIC_OR_DEPARTMENT_OTHER): Payer: Self-pay | Admitting: Physical Therapy

## 2021-09-29 ENCOUNTER — Other Ambulatory Visit: Payer: Self-pay

## 2021-09-29 ENCOUNTER — Ambulatory Visit (HOSPITAL_BASED_OUTPATIENT_CLINIC_OR_DEPARTMENT_OTHER): Payer: PRIVATE HEALTH INSURANCE | Attending: Family Medicine | Admitting: Physical Therapy

## 2021-09-29 DIAGNOSIS — M5416 Radiculopathy, lumbar region: Secondary | ICD-10-CM | POA: Insufficient documentation

## 2021-09-29 DIAGNOSIS — G8929 Other chronic pain: Secondary | ICD-10-CM | POA: Insufficient documentation

## 2021-09-29 DIAGNOSIS — M5442 Lumbago with sciatica, left side: Secondary | ICD-10-CM | POA: Insufficient documentation

## 2021-09-29 NOTE — Therapy (Signed)
OUTPATIENT PHYSICAL THERAPY TREATMENT NOTE   Patient Name: Derek Potts MRN: [id number] DOB:Sep 02, 1975, 46 y.o., male Today's Date: 09/29/2021  PCP: Sharlene Dory, DO REFERRING PROVIDER: Rhone, Wnorowski*   PT End of Session - 09/29/21 0849     Visit Number 7    Number of Visits 12    Date for PT Re-Evaluation 07/27/21    Authorization Type medcost    PT Start Time 0845    PT Stop Time 0926    PT Time Calculation (min) 41 min    Activity Tolerance Patient tolerated treatment well    Behavior During Therapy [facility] for tasks assessed/performed             Past Medical History:  Diagnosis Date   Hyperlipidemia    History reviewed. No pertinent surgical history. Patient Active Problem List   Diagnosis Date Noted   Lumbar radiculopathy 01/06/2021    PCP: Sharlene Dory, DO   REFERRING PROVIDER: Myra Rude, MD   REFERRING DIAG: M54.16 (ICD-10-CM) - Lumbar radiculopathy   THERAPY DIAG:  M54.16 (ICD-10-CM) - Lumbar radiculopathy   ONSET DATE: 1 year but increased pain over the past few months   SUBJECTIVE:                                                                                                                                                                                            SUBJECTIVE STATEMENT: The pain down his leg has resolved. He is really just having pain when he is bending over. He has some tnesion in his lower back  PERTINENT HISTORY:  Nothing significant    PAIN:  Are you having pain? no VAS scale: 0/10  when he woke up Pain location: left lower leg   Pain orientation: Left  PAIN TYPE: aching Pain description: constant  Aggravating factors: sitting; night time Relieving factors: rest   radiates into the left lower leg.  Objective   MMT Right 06/16/2021 Left 06/16/2021  Hip flexion 25 1/18 52.5 26.5 1/18 49    Hip extension      Hip abduction 31 1/18 59.5 35 1/18 65.5  Hip adduction      Hip  internal rotation      Hip external rotation      Knee flexion      Knee extension 45.1 1/18 72.3 30 1/18 53.1  Ankle dorsiflexion      Ankle plantarflexion      Ankle inversion      Ankle eversion       (Blank rows = not tested) FOTO:  47% expected 72% ability in 6 visits  Motion :  Flexion 70 degrees without pain  Full rotation bilateral  Improved pain with extension    TODAY'S TREATMENT  2/21 Manual:  Manual: trigger point release to lower lumbar spine and upper gluteal focused on the left: LAD grade III and IV occialtions bilateral.  Nerve gliding with ankle x5  Nerve gliding with head slump x5  Prayer stretch with hand hold for traction 3x20 sec hold      Bridges 2x10 with green band  Supne march x30  Supine green band clamshell x30   Standing shoulder extension x30 blue  Standing row x30 blue   1/18 SLR 2x10 bilateral                Bridges 2x10 Proner press-up 2x10  Reviewed reuslts of tests and measures. Also reviewed L5 nerve root.  LAQ green band 3x15    Manual: trigger point release to lower lumbar spine and upper gluteal focused on the left: LAD grade III and IV occialtions bilateral.    Manual: trigger point release to lower lumbar spine and upper gluteal focused on the left: LAD grade III and IV occialtions bilateral.   12/16 Prone press ups 2x15  Prone press ups 2x15 Bridges 2x10 Supine march 2x10  Seated clamshell 2x10 Rows: 15 lbs 2x10 Shoulder extension 2x10 10 lbs  Manual: trigger point release to lower lumbar spine and upper gluteal focused on the left: LAD grade III and IV occialtions bilateral.    11/29 Manual: trigger point release to lower lumbar spine and upper gluteal focused on the left: LAD grade III and IV occialtions bilateral.  Trigger point dry needling: consent given, explained procedure: skilled palpation of trigger points and monitoring of symptoms during treatment. Needles:     .30x50  Muscles Needled: Left L3 and L4  paraspinals    Prone press ups 2x15 Bridges 2x10 Supine march 2x10  Seated clamshell 2x10 Rows: 15 lbs 2x10 Shoulder extension 2x10 10 lbs                      PATIENT EDUCATION:  Education details: reviewed benefits and risks of nerve flossing  Person educated: Patient Education method: Explanation Education comprehension: verbalized understanding     HOME EXERCISE PROGRAM: Access Code: [email or web address] URL: [email or web address] Date: 06/15/2021 Prepared by: Lorayne Bender   Exercises Seated Hamstring Stretch - 1 x daily - 7 x weekly - 3 sets - 10 reps Static Prone on Elbows - 1 x daily - 7 x weekly - 3 sets - 10 reps Standing Glute Med Mobilization with Small Ball on Wall - 1 x daily - 7 x weekly - 1 reps - 1 min hold     ASSESSMENT:   CLINICAL IMPRESSION: Over the past few weeks the patients radicular pain has resolved. He has just had tension in his back when bending. Therapy reviewed nerve gliding to improve neural tension with bending. He was advised to perform these nerve glides just a few times a week. He had no increase in pain with this. We also reviewed prayer stretching. We reviewed base exercises to work on at home. He had mild tension in his lower lumbar spine with manual therapy. He had no pain with his exercises. If he does well next visit we will likely D/C to HEP   REHAB POTENTIAL: Excellent   CLINICAL DECISION MAKING: Evolving/moderate complexity increasing radicular symptoms    EVALUATION COMPLEXITY: Moderate     GOALS: Goals reviewed with patient? Yes  SHORT TERM GOALS:   STG Name Target Date Goal status 1/18  1 Patient will increase lumbar flexion by 20 degrees without radicular pain  Baseline:  07/07/2021 Improved 30 degrees  Achieved   2 Patient will increase left knee extension strength  to within 5 degrees of the right  07/07/2021 All measures improved by at least 20  Achieved and revised   3 Patient will report centralized  lower back pain with a 75% recution in radicular symptoms down his leg  Baseline: 07/07/2021 Continues to have pain into his lower leg and foot. The instances of pain have reduced significantly but intensity and area is about the same   LONG TERM GOALS:    LTG Name Target Date Goal status  1 Patient will sit for > 2 hours at work without increased pain in order to go to meetings at work Baseline: 07/28/2021 When he sits all day it becomes painful  Ongoing   2 Patient will be independent with a complete program for strengthening and stretching  Baseline: 07/28/2021 Still advancing exercises  3 Patient will return to cycling without increased pain  Baseline: 07/28/2021 Has not returned to cycling  Ongoing   PLAN: PT FREQUENCY: 2x/week   PT DURATION: 6 weeks   PLANNED INTERVENTIONS: Therapeutic exercises, Therapeutic activity, Neuro Muscular re-education, Balance training, Gait training, Patient/Family education, Joint mobilization, Aquatic Therapy, Dry Needling, Electrical stimulation, Spinal mobilization, Cryotherapy, Moist heat, Taping, Traction, Ultrasound, and Manual therapy   PLAN FOR NEXT SESSION: consider LAD; assess hip mobility; work on [employer] progression; manual therapy to lumbar spine; begin basic core exercises.      Dessie Coma PT DPT  09/29/2021, 9:40 AM

## 2021-10-06 ENCOUNTER — Ambulatory Visit (HOSPITAL_BASED_OUTPATIENT_CLINIC_OR_DEPARTMENT_OTHER): Payer: PRIVATE HEALTH INSURANCE | Admitting: Physical Therapy

## 2021-10-06 ENCOUNTER — Other Ambulatory Visit: Payer: Self-pay

## 2021-10-06 ENCOUNTER — Encounter (HOSPITAL_BASED_OUTPATIENT_CLINIC_OR_DEPARTMENT_OTHER): Payer: Self-pay | Admitting: Physical Therapy

## 2021-10-06 DIAGNOSIS — M5416 Radiculopathy, lumbar region: Secondary | ICD-10-CM

## 2021-10-06 DIAGNOSIS — G8929 Other chronic pain: Secondary | ICD-10-CM

## 2021-10-06 NOTE — Therapy (Signed)
OUTPATIENT PHYSICAL THERAPY TREATMENT NOTE   Patient Name: Derek Potts MRN: [id number] DOB:1976/06/18, 46 y.o., male Today's Date: 10/06/2021  PCP: Sharlene Dory, DO REFERRING PROVIDER: Earvin, Blazier*   PT End of Session - 10/06/21 0915     Visit Number 8    Number of Visits 12    Date for PT Re-Evaluation 11/17/21    Authorization Type medcost    PT Start Time 0845    PT Stop Time 0925    PT Time Calculation (min) 40 min    Activity Tolerance Patient tolerated treatment well    Behavior During Therapy [facility] for tasks assessed/performed              Past Medical History:  Diagnosis Date   Hyperlipidemia    History reviewed. No pertinent surgical history. Patient Active Problem List   Diagnosis Date Noted   Lumbar radiculopathy 01/06/2021    PCP: Sharlene Dory, DO   REFERRING PROVIDER: Myra Rude, MD   REFERRING DIAG: M54.16 (ICD-10-CM) - Lumbar radiculopathy   THERAPY DIAG:  M54.16 (ICD-10-CM) - Lumbar radiculopathy   ONSET DATE: 1 year but increased pain over the past few months   SUBJECTIVE:                                                                                                                                                                                            SUBJECTIVE STATEMENT: Patient has felt  quick twinges of pain down his lwg over the past few days. He continues to feel some tension in his back but it is better.   PERTINENT HISTORY:  Nothing significant    PAIN:  Are you having pain? no VAS scale: 0/10  when he woke up Pain location: left lower leg   Pain orientation: Left  PAIN TYPE: aching Pain description: constant  Aggravating factors: sitting; night time Relieving factors: rest    Objective   MMT Right 06/16/2021 Left 06/16/2021  Hip flexion 25 1/18 52.5 26.5 1/18 49      Hip extension      Hip abduction 31 1/18 59.5 35 1/18 65.5  Hip adduction      Hip internal  rotation      Hip external rotation      Knee flexion      Knee extension 45.1 1/18 72.3 30 1/18 53.1  Ankle dorsiflexion      Ankle plantarflexion      Ankle inversion      Ankle eversion       (Blank rows = not tested) FOTO:  47% expected 72% ability in 6 visits  Motion :  Flexion 70 degrees without pain  Full rotation bilateral  Improved pain with extension     TODAY'S TREATMENT  2/28 Manual: trigger point release to lower lumbar spine and upper gluteal focused on the left: LAD grade III and IV occialtions bilateral.    Nerve glide: Patient felt nerve glide with just a hamstring stretch.   Prone press up 2x20   Supine march with switch x30  Supine hip abdcution blue x30 ( could feel some tightening    Seated LAQ Blue band 2x20 with cuing for posture and breathing   Cable extension x30 15 lbs  [employer] x30 15 lbs   2/21 Manual:  Manual: trigger point release to lower lumbar spine and upper gluteal focused on the left: LAD grade III and IV occialtions bilateral.  Nerve gliding with ankle x5  Nerve gliding with head slump x5  Prayer stretch with hand hold for traction 3x20 sec hold      Bridges 2x10 with green band  Supne march x30  Supine green band clamshell x30   Standing shoulder extension x30 blue  Standing row x30 blue   1/18 SLR 2x10 bilateral                Bridges 2x10 Proner press-up 2x10  Reviewed reuslts of tests and measures. Also reviewed L5 nerve root.  LAQ green band 3x15    Manual: trigger point release to lower lumbar spine and upper gluteal focused on the left: LAD grade III and IV occialtions bilateral.    Manual: trigger point release to lower lumbar spine and upper gluteal focused on the left: LAD grade III and IV occialtions bilateral.   12/16 Prone press ups 2x15  Prone press ups 2x15 Bridges 2x10 Supine march 2x10  Seated clamshell 2x10 Rows: 15 lbs 2x10 Shoulder extension 2x10 10 lbs  Manual: trigger point release to  lower lumbar spine and upper gluteal focused on the left: LAD grade III and IV occialtions bilateral.    11/29 Manual: trigger point release to lower lumbar spine and upper gluteal focused on the left: LAD grade III and IV occialtions bilateral.  Trigger point dry needling: consent given, explained procedure: skilled palpation of trigger points and monitoring of symptoms during treatment. Needles:     .30x50  Muscles Needled: Left L3 and L4 paraspinals    Prone press ups 2x15 Bridges 2x10 Supine march 2x10  Seated clamshell 2x10 Rows: 15 lbs 2x10 Shoulder extension 2x10 10 lbs                      PATIENT EDUCATION:  Education details: reviewed benefits and risks of nerve flossing  Person educated: Patient Education method: Explanation Education comprehension: verbalized understanding     HOME EXERCISE PROGRAM: Access Code: [email or web address] URL: [email or web address] Date: 06/15/2021 Prepared by: Lorayne Bender   Exercises Seated Hamstring Stretch - 1 x daily - 7 x weekly - 3 sets - 10 reps Static Prone on Elbows - 1 x daily - 7 x weekly - 3 sets - 10 reps Standing Glute Med Mobilization with Small Ball on Wall - 1 x daily - 7 x weekly - 1 reps - 1 min hold     ASSESSMENT:   CLINICAL IMPRESSION: The patient is making progress. He is still having brief movements of radicular pain, but overall his pain has improved significantly. He reports it is easier to put his socks on now. We reviewed his program with hi,m. He still  needs to work on improving his neural tension. He flet a stretch with just hamstring stretch today. We reviewed his home program. At this time we will put him on hold to see how he does with his HEP on his own.If he needs another visit he will call to schedule.    REHAB POTENTIAL: Excellent   CLINICAL DECISION MAKING: Evolving/moderate complexity increasing radicular symptoms    EVALUATION COMPLEXITY: Moderate     GOALS: Goals reviewed with  patient? Yes   SHORT TERM GOALS:   STG Name Target Date Goal status 1/18  1 Patient will increase lumbar flexion by 20 degrees without radicular pain  Baseline:  07/07/2021 Improved 30 degrees  Achieved   2 Patient will increase left knee extension strength  to within 5 degrees of the right  07/07/2021 All measures improved by at least 20  Achieved and revised   3 Patient will report centralized lower back pain with a 75% recution in radicular symptoms down his leg  Baseline: 07/07/2021 Continues to have pain into his lower leg and foot. The instances of pain have reduced significantly but intensity and area is about the same   LONG TERM GOALS:    LTG Name Target Date Goal status  1 Patient will sit for > 2 hours at work without increased pain in order to go to meetings at work Baseline: 07/28/2021 When he sits all day it becomes painful  Ongoing   2 Patient will be independent with a complete program for strengthening and stretching  Baseline: 07/28/2021 Still advancing exercises  3 Patient will return to cycling without increased pain  Baseline: 07/28/2021 Has not returned to cycling  Ongoing   PLAN: PT FREQUENCY: 2x/week   PT DURATION: 6 weeks   PLANNED INTERVENTIONS: Therapeutic exercises, Therapeutic activity, Neuro Muscular re-education, Balance training, Gait training, Patient/Family education, Joint mobilization, Aquatic Therapy, Dry Needling, Electrical stimulation, Spinal mobilization, Cryotherapy, Moist heat, Taping, Traction, Ultrasound, and Manual therapy   PLAN FOR NEXT SESSION: consider LAD; assess hip mobility; work on [employer] progression; manual therapy to lumbar spine; begin basic core exercises.      Dessie Coma PT DPT  10/06/2021, 1:34 PM

## 2021-12-07 ENCOUNTER — Encounter (HOSPITAL_BASED_OUTPATIENT_CLINIC_OR_DEPARTMENT_OTHER): Payer: Self-pay | Admitting: Physical Therapy

## 2021-12-23 ENCOUNTER — Ambulatory Visit (INDEPENDENT_AMBULATORY_CARE_PROVIDER_SITE_OTHER): Payer: PRIVATE HEALTH INSURANCE | Admitting: Family Medicine

## 2021-12-23 ENCOUNTER — Encounter: Payer: Self-pay | Admitting: Family Medicine

## 2021-12-23 VITALS — BP 120/82 | HR 65 | Temp 97.9°F | Ht 69.0 in | Wt 215.5 lb

## 2021-12-23 DIAGNOSIS — Z125 Encounter for screening for malignant neoplasm of prostate: Secondary | ICD-10-CM | POA: Diagnosis not present

## 2021-12-23 DIAGNOSIS — Z0000 Encounter for general adult medical examination without abnormal findings: Secondary | ICD-10-CM

## 2021-12-23 LAB — COMPREHENSIVE METABOLIC PANEL
ALT: 19 U/L (ref 0–53)
AST: 20 U/L (ref 0–37)
Albumin: 4.5 g/dL (ref 3.5–5.2)
Alkaline Phosphatase: 66 U/L (ref 39–117)
BUN: 16 mg/dL (ref 6–23)
CO2: 27 mEq/L (ref 19–32)
Calcium: 9.1 mg/dL (ref 8.4–10.5)
Chloride: 104 mEq/L (ref 96–112)
Creatinine, Ser: 1 mg/dL (ref 0.40–1.50)
GFR: 89.36 mL/min (ref >60.00)
Glucose, Bld: 96 mg/dL (ref 70–99)
Potassium: 3.6 mEq/L (ref 3.5–5.1)
Sodium: 138 mEq/L (ref 135–145)
Total Bilirubin: 0.6 mg/dL (ref 0.2–1.2)
Total Protein: 7 g/dL (ref 6.0–8.3)

## 2021-12-23 LAB — CBC
HCT: 40.7 % (ref 39.0–52.0)
Hemoglobin: 13.4 g/dL (ref 13.0–17.0)
MCHC: 33 g/dL (ref 30.0–36.0)
MCV: 90 fl (ref 78.0–100.0)
Platelets: 181 10*3/uL (ref 150.0–400.0)
RBC: 4.52 Mil/uL (ref 4.22–5.81)
RDW: 14 % (ref 11.5–15.5)
WBC: 3.7 10*3/uL — ABNORMAL LOW (ref 4.0–10.5)

## 2021-12-23 LAB — LIPID PANEL
Cholesterol: 186 mg/dL (ref 0–200)
HDL: 31.7 mg/dL — ABNORMAL LOW (ref >39.00)
LDL Cholesterol: 130 mg/dL — ABNORMAL HIGH (ref 0–99)
NonHDL: 154.41
Total CHOL/HDL Ratio: 6
Triglycerides: 120 mg/dL (ref 0.0–149.0)
VLDL: 24 mg/dL (ref 0.0–40.0)

## 2021-12-23 LAB — PSA: PSA: 0.72 ng/mL (ref 0.10–4.00)

## 2021-12-23 NOTE — Progress Notes (Signed)
Chief Complaint  ?Patient presents with  ? Annual Exam  ? ? ?Well Male ?Derek Potts is here for a complete physical.   ?His last physical was >1 year ago.  ?Current diet: in general, a "healthy" diet.   ?Current exercise: active in yard ?Weight trend: stable ?Fatigue out of ordinary? No. ?Seat belt? Yes.   ?Advanced directive? No ? ?Health maintenance ?Tetanus- Yes ?HIV- Yes ?Hep C- Yes ? ?Past Medical History:  ?Diagnosis Date  ? Hyperlipidemia   ?  ? ?Past Surgical History:  ?Procedure Laterality Date  ? OTHER SURGICAL HISTORY Left 1980  ? Fixed broken arm and leg  ? ? ?Medications  ?Takes no meds routinely.  ? ?Allergies ?No Known Allergies ? ?Family History ?Family History  ?Problem Relation Age of Onset  ? Hypertension Father   ? ? ?Review of Systems: ?Constitutional: no fevers or chills ?Eye:  no recent significant change in vision ?Ear/Nose/Mouth/Throat:  Ears:  no hearing loss ?Nose/Mouth/Throat:  no complaints of nasal congestion, no sore throat ?Cardiovascular:  no chest pain ?Respiratory:  no shortness of breath ?Gastrointestinal:  no abdominal pain, no change in bowel habits ?GU:  Male: negative for dysuria, frequency, and incontinence ?Musculoskeletal/Extremities:  no new pain of the joints ?Integumentary (Skin/Breast):  no abnormal skin lesions reported ?Neurologic:  no headaches ?Endocrine: No unexpected weight changes ?Hematologic/Lymphatic:  no night sweats ? ?Exam ?BP 120/82   Pulse 65   Temp 97.9 ?F (36.6 ?C) (Oral)   Ht 5\' 9"  (1.753 m)   Wt 215 lb 8 oz (97.8 kg)   SpO2 98%   BMI 31.82 kg/m?  ?General:  well developed, well nourished, in no apparent distress ?Skin:  no significant moles, warts, or growths ?Head:  no masses, lesions, or tenderness ?Eyes:  pupils equal and round, sclera anicteric without injection ?Ears:  canals without lesions, TMs shiny without retraction, no obvious effusion, no erythema ?Nose:  nares patent, septum midline, mucosa normal ?Throat/Pharynx:  lips and gingiva  without lesion; tongue and uvula midline; non-inflamed pharynx; no exudates or postnasal drainage ?Neck: neck supple without adenopathy, thyromegaly, or masses ?Lungs:  clear to auscultation, breath sounds equal bilaterally, no respiratory distress ?Cardio:  regular rate and rhythm, no bruits, no LE edema ?Abdomen:  abdomen soft, nontender; bowel sounds normal; no masses or organomegaly ?Rectal: Deferred ?Musculoskeletal:  symmetrical muscle groups noted without atrophy or deformity ?Extremities:  no clubbing, cyanosis, or edema, no deformities, no skin discoloration ?Neuro:  gait normal; deep tendon reflexes normal and symmetric ?Psych: well oriented with normal range of affect and appropriate judgment/insight ? ?Assessment and Plan ? ?Well adult exam - Plan: CBC, Comprehensive metabolic panel, Lipid panel ? ?Screening for prostate cancer - Plan: PSA  ? ?Well 46 y.o. male. ?Counseled on diet and exercise. ?Counseled on risks and benefits of prostate cancer screening with PSA. The patient agrees to undergo screening.  ?Advanced directive form provided today.  ?Other orders as above. ?Follow up in 1 yr pending the above workup. ?The patient voiced understanding and agreement to the plan. ? ?Toshio Slusher, DO ?12/23/21 ?8:17 AM ? ?

## 2021-12-23 NOTE — Patient Instructions (Addendum)
Give us 2-3 business days to get the results of your labs back.  ? ?Keep the diet clean and stay active. ? ?Advanced directive form provided today.  ? ?Let us know if you need anything. ?

## 2022-11-10 IMAGING — DX DG LUMBAR SPINE 2-3V
3 series · 3 of 3 positions shown · non-contrast
Comparison: 02/18/2020.

CLINICAL DATA: Left-sided low back pain with radiation down left
leg.

EXAM:
LUMBAR SPINE - 2-3 VIEW

[l-spine ap]
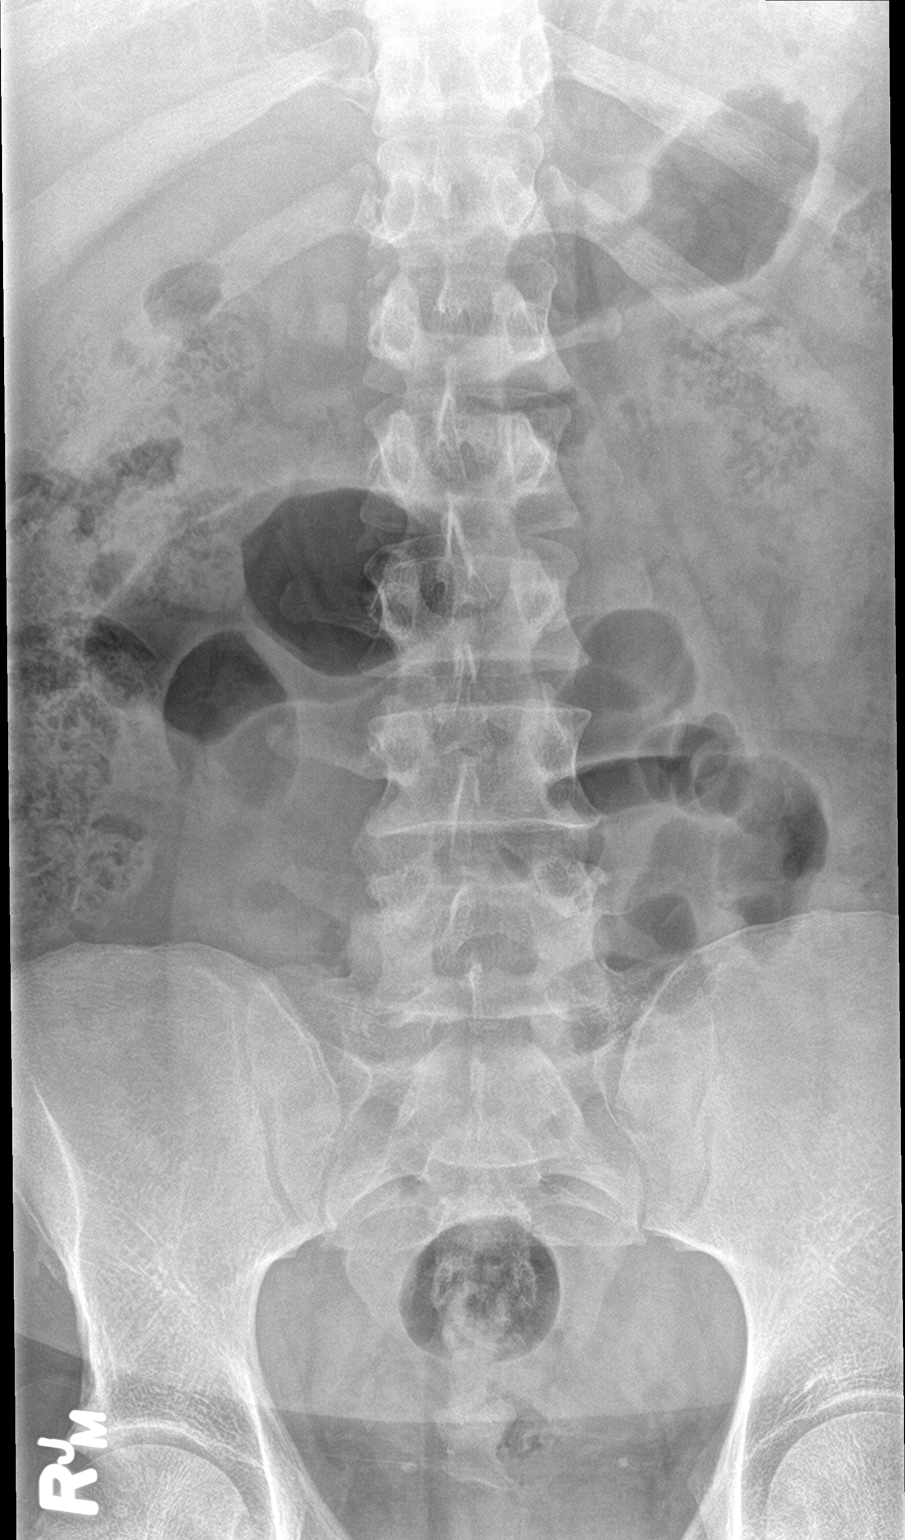

[l-spine lat]
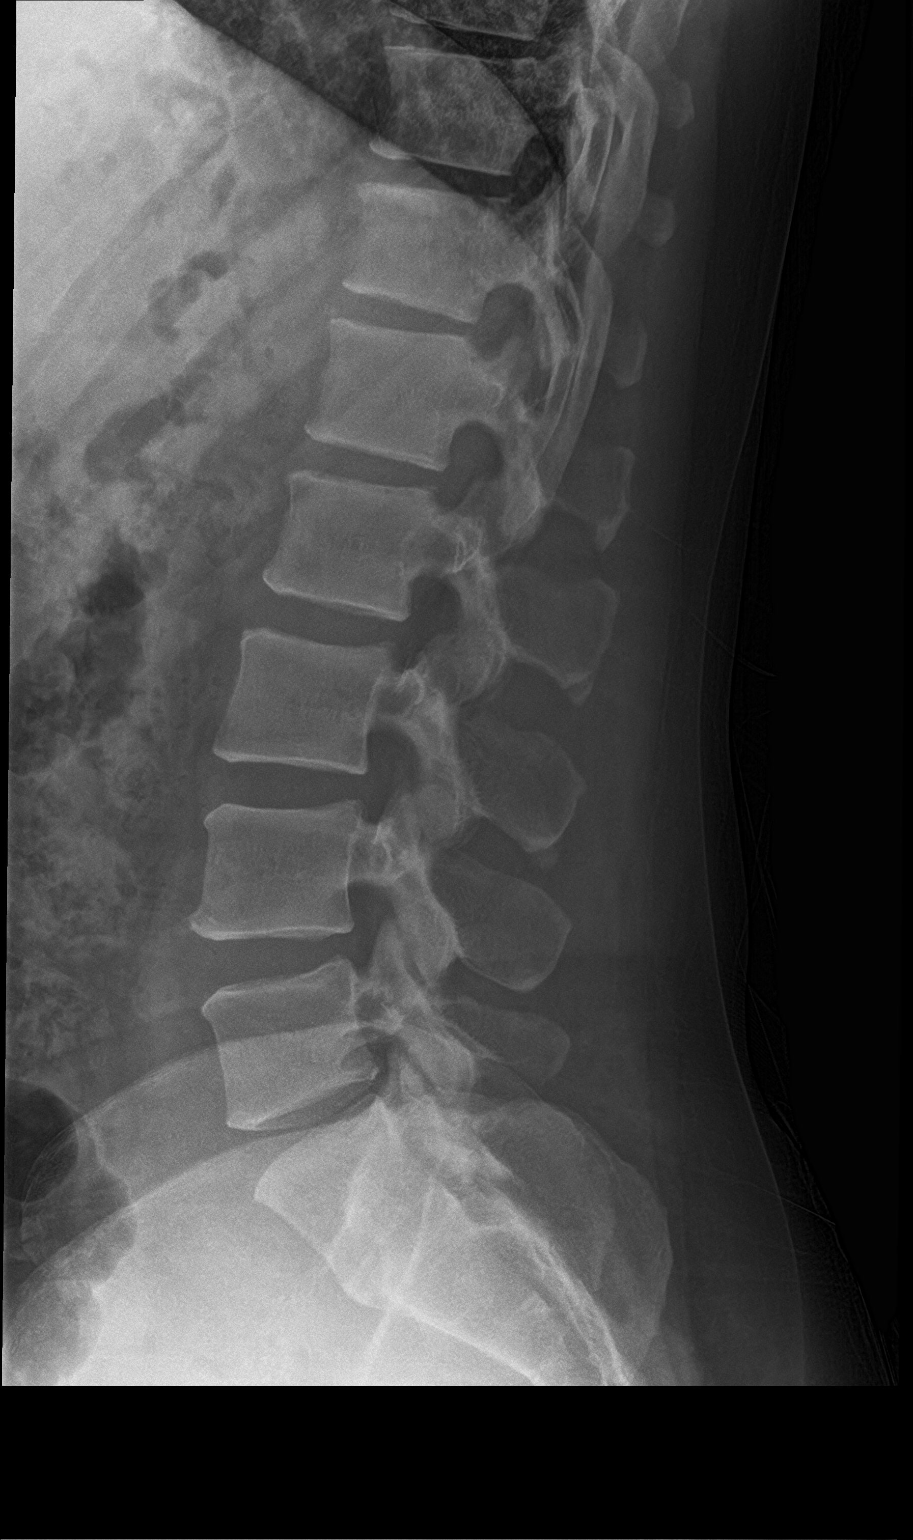

[l-spine spot]
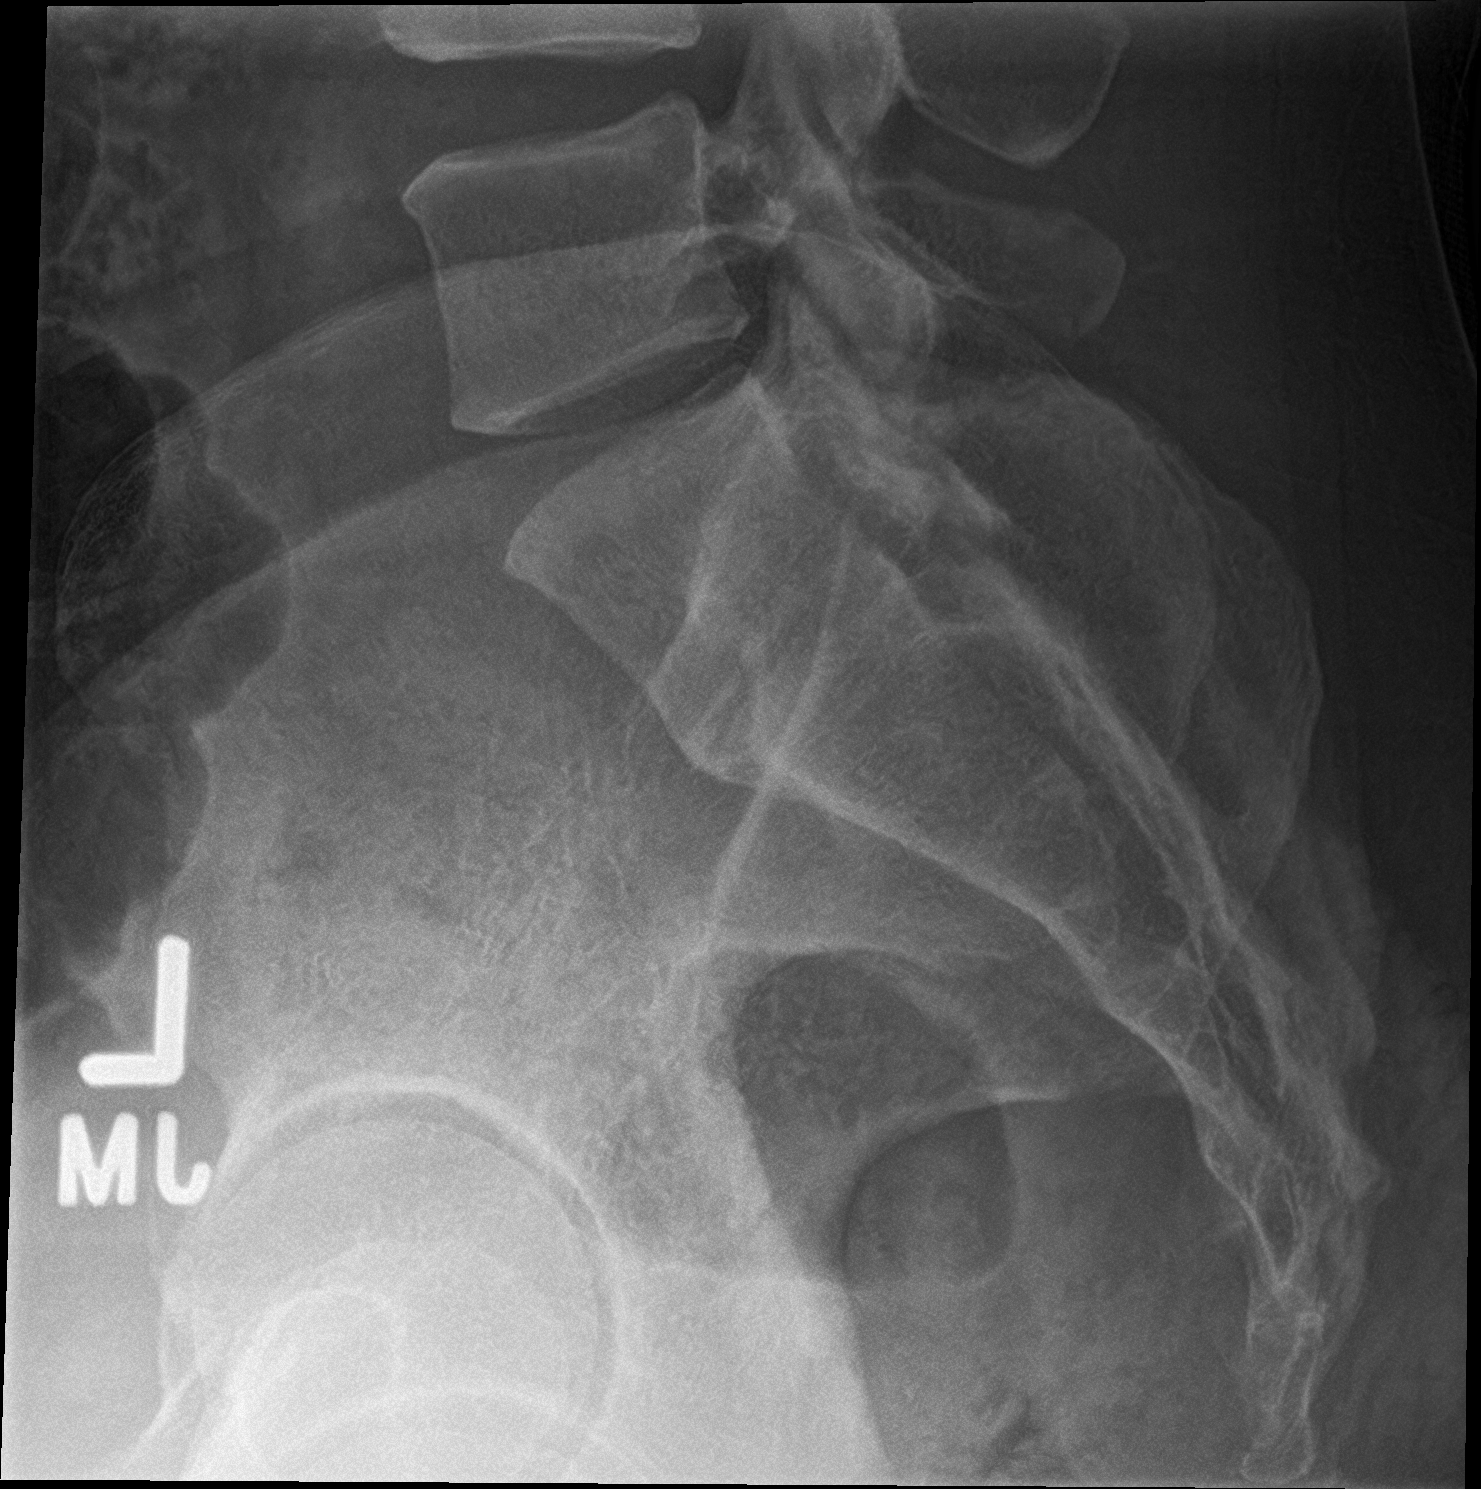

[3 of 3 positions shown; findings below may reference images not displayed]

FINDINGS: Paraspinal soft tissues are unremarkable. Minimal degenerative
change. No acute or focal bony abnormality identified.
IMPRESSION: Minimal degenerative change. No acute or focal abnormality
identified.

## 2022-11-22 ENCOUNTER — Encounter: Payer: Self-pay | Admitting: *Deleted

## 2022-12-31 ENCOUNTER — Encounter: Payer: PRIVATE HEALTH INSURANCE | Admitting: Family Medicine

## 2023-01-10 ENCOUNTER — Encounter: Payer: Self-pay | Admitting: Family Medicine

## 2023-01-10 ENCOUNTER — Ambulatory Visit: Payer: PRIVATE HEALTH INSURANCE | Admitting: Family Medicine

## 2023-01-10 VITALS — BP 120/78 | HR 91 | Temp 99.1°F | Ht 69.0 in | Wt 228.4 lb

## 2023-01-10 DIAGNOSIS — L989 Disorder of the skin and subcutaneous tissue, unspecified: Secondary | ICD-10-CM

## 2023-01-10 MED ORDER — TRIAMCINOLONE ACETONIDE 0.1 % EX CREA
1.0000 | TOPICAL_CREAM | Freq: Two times a day (BID) | CUTANEOUS | 0 refills | Status: DC
Start: 2023-01-10 — End: 2023-10-26

## 2023-01-10 MED ORDER — CEPHALEXIN 500 MG PO CAPS: 500.0000 mg | ORAL_CAPSULE | Freq: Three times a day (TID) | ORAL | 0 refills | Status: AC

## 2023-01-10 NOTE — Patient Instructions (Addendum)
Try not to scratch as this can make things worse. Avoid scented products while dealing with this. You may resume when the itchiness resolves. Cold/cool compresses can help.   Consider an oral antihistamine like Zyrtec, Claritin or Allegra to help with the itching.  Send me a message next week if no better.  Let us know if you need anything.

## 2023-01-10 NOTE — Progress Notes (Signed)
Chief Complaint  Patient presents with   Rash    Arms and abdomen     Laiden Kaniecki is a 47 y.o. male here for a skin complaint.  Duration: 2 weeks Location: arms/abd Pruritic? Yes Painful? Yes Drainage? No New soaps/lotions/topicals/detergents? No Sick contacts? No Other associated symptoms: spreading No fevers, palm involvement, recent illness.  Therapies tried thus far: lotions  Past Medical History:  Diagnosis Date   Hyperlipidemia     BP 120/78 (BP Location: Left Arm, Patient Position: Sitting, Cuff Size: Normal)   Pulse 91   Temp 99.1 F (37.3 C) (Oral)   Ht 5\' 9"  (1.753 m)   Wt 228 lb 6 oz (103.6 kg)   SpO2 97%   BMI 33.73 kg/m  Gen: awake, alert, appearing stated age Lungs: No accessory muscle use Skin: Papules/pustules noted on forearms, legs and R abd. No drainage, erythema, TTP, fluctuance, excoriation Psych: Age appropriate judgment and insight  Skin lesions - Plan: cephALEXin (KEFLEX) 500 MG capsule, triamcinolone cream (KENALOG) 0.1 %  Try not to scratch. Cool compresses, Kenalog prn, Keflex to cover for folliculitis. PO antihistamine. Send message next week if no better, would consider a punch biopsy vs referral.  F/u prn. The patient voiced understanding and agreement to the plan.  Mckenzie Nudd [place], DO 01/10/23 9:18 AM

## 2023-01-19 ENCOUNTER — Encounter: Payer: PRIVATE HEALTH INSURANCE | Admitting: Family Medicine

## 2023-10-26 ENCOUNTER — Encounter: Payer: Self-pay | Admitting: Family Medicine

## 2023-10-26 ENCOUNTER — Ambulatory Visit (INDEPENDENT_AMBULATORY_CARE_PROVIDER_SITE_OTHER): Payer: PRIVATE HEALTH INSURANCE | Admitting: Family Medicine

## 2023-10-26 VITALS — BP 126/82 | HR 86 | Temp 98.9°F | Ht 69.0 in | Wt 227.4 lb

## 2023-10-26 DIAGNOSIS — Z0000 Encounter for general adult medical examination without abnormal findings: Secondary | ICD-10-CM

## 2023-10-26 DIAGNOSIS — E876 Hypokalemia: Secondary | ICD-10-CM | POA: Diagnosis not present

## 2023-10-26 DIAGNOSIS — E782 Mixed hyperlipidemia: Secondary | ICD-10-CM | POA: Diagnosis not present

## 2023-10-26 DIAGNOSIS — Z125 Encounter for screening for malignant neoplasm of prostate: Secondary | ICD-10-CM

## 2023-10-26 DIAGNOSIS — Z8639 Personal history of other endocrine, nutritional and metabolic disease: Secondary | ICD-10-CM

## 2023-10-26 LAB — CBC
HCT: 42.4 % (ref 39.0–52.0)
Hemoglobin: 14.3 g/dL (ref 13.0–17.0)
MCHC: 33.7 g/dL (ref 30.0–36.0)
MCV: 89.8 fl (ref 78.0–100.0)
Platelets: 188 10*3/uL (ref 150.0–400.0)
RBC: 4.73 Mil/uL (ref 4.22–5.81)
RDW: 14 % (ref 11.5–15.5)
WBC: 4.1 10*3/uL (ref 4.0–10.5)

## 2023-10-26 LAB — LIPID PANEL
Cholesterol: 194 mg/dL (ref 0–200)
HDL: 32.3 mg/dL — ABNORMAL LOW (ref >39.00)
LDL Cholesterol: 126 mg/dL — ABNORMAL HIGH (ref 0–99)
NonHDL: 161.55
Total CHOL/HDL Ratio: 6
Triglycerides: 176 mg/dL — ABNORMAL HIGH (ref 0.0–149.0)
VLDL: 35.2 mg/dL (ref 0.0–40.0)

## 2023-10-26 LAB — COMPREHENSIVE METABOLIC PANEL
ALT: 22 U/L (ref 0–53)
AST: 20 U/L (ref 0–37)
Albumin: 4.5 g/dL (ref 3.5–5.2)
Alkaline Phosphatase: 78 U/L (ref 39–117)
BUN: 15 mg/dL (ref 6–23)
CO2: 28 meq/L (ref 19–32)
Calcium: 9.2 mg/dL (ref 8.4–10.5)
Chloride: 103 meq/L (ref 96–112)
Creatinine, Ser: 0.92 mg/dL (ref 0.40–1.50)
GFR: 97.49 mL/min (ref >60.00)
Glucose, Bld: 104 mg/dL — ABNORMAL HIGH (ref 70–99)
Potassium: 3.4 meq/L — ABNORMAL LOW (ref 3.5–5.1)
Sodium: 137 meq/L (ref 135–145)
Total Bilirubin: 0.7 mg/dL (ref 0.2–1.2)
Total Protein: 7 g/dL (ref 6.0–8.3)

## 2023-10-26 LAB — PSA: PSA: 1.13 ng/mL (ref 0.10–4.00)

## 2023-10-26 NOTE — Progress Notes (Signed)
 Chief Complaint  Patient presents with   Annual Exam    Patient presents today for a physical exam.    [name] is here for a complete physical.   His last physical was >1 year ago.  Current diet: in general, a "healthy" diet.   Current exercise: limited Weight trend: stable Fatigue out of ordinary? No. Seat belt? Yes.   Advanced directive? No  Health maintenance Tetanus- Yes HIV- Yes Hep C- Yes  Past Medical History:  Diagnosis Date   Hyperlipidemia      Past Surgical History:  Procedure Laterality Date   OTHER SURGICAL HISTORY Left 1980   Fixed broken arm and leg    Medications  Takes no meds routinely.    Allergies No Known Allergies  Family History Family History  Problem Relation Age of Onset   Hypertension Father     Review of Systems: Constitutional: no fevers or chills Eye:  no recent significant change in vision Ear/Nose/Mouth/Throat:  Ears:  no hearing loss Nose/Mouth/Throat:  no complaints of nasal congestion, no sore throat Cardiovascular:  no chest pain Respiratory:  no shortness of breath Gastrointestinal:  no abdominal pain, no change in bowel habits GU:  Male: negative for dysuria, frequency, and incontinence Musculoskeletal/Extremities:  no pain of the joints Integumentary (Skin/Breast):  no abnormal skin lesions reported Neurologic:  no headaches Endocrine: No unexpected weight changes Hematologic/Lymphatic:  no night sweats  Exam BP 126/82 (BP Location: Left Arm, Cuff Size: Normal)   Pulse 86   Temp 98.9 F (37.2 C)   Ht 5\' 9"  (1.753 m)   Wt 227 lb 6.4 oz (103.1 kg)   SpO2 98%   BMI 33.58 kg/m  General:  well developed, well nourished, in no apparent distress Skin:  no significant moles, warts, or growths Head:  no masses, lesions, or tenderness Eyes:  pupils equal and round, sclera anicteric without injection Ears:  canals without lesions, TMs shiny without retraction, no obvious effusion, no erythema Nose:   nares patent, mucosa normal Throat/Pharynx:  lips and gingiva without lesion; tongue and uvula midline; non-inflamed pharynx; no exudates or postnasal drainage Neck: neck supple without adenopathy, thyromegaly, or masses Lungs:  clear to auscultation, breath sounds equal bilaterally, no respiratory distress Cardio:  regular rate and rhythm, no bruits, no LE edema Abdomen:  abdomen soft, nontender; bowel sounds normal; no masses or organomegaly Rectal: Deferred Musculoskeletal:  symmetrical muscle groups noted without atrophy or deformity Extremities:  no clubbing, cyanosis, or edema, no deformities, no skin discoloration Neuro:  gait normal; deep tendon reflexes normal and symmetric Psych: well oriented with normal range of affect and appropriate judgment/insight  Assessment and Plan  Well adult exam - Plan: CBC, Comprehensive metabolic panel, Lipid panel  Screening for prostate cancer - Plan: PSA   Well 48 y.o. male. Counseled on diet and exercise. Needs to improve exercising.  Counseled on risks and benefits of prostate cancer screening with PSA. The patient agrees to undergo screening.  Shingrix rec'd for when he turns 48.  Other orders as above. Follow up in 1 yr pending the above workup. The patient voiced understanding and agreement to the plan.  Derek Aversa [place], DO 10/26/23 8:29 AM

## 2023-10-26 NOTE — Patient Instructions (Addendum)
 Give [place] 2-3 business days to get the results of your labs back.   Keep the diet clean and stay active.  Aim to do some physical exertion for 150 minutes per week. This is typically divided into 5 days per week, 30 minutes per day. The activity should be enough to get your heart rate up. Anything is better than nothing if you have time constraints.  Please get me a copy of your advanced directive form at your convenience.   For people 80 and older: The Shingrix vaccine (for shingles) is a 2 shot series spaced 2-6 months apart. It can make people feel low energy, achy and almost like they have the flu for 48 hours after injection. 1/5 people can have nausea and/or vomiting. Please plan accordingly when deciding on when to get this shot. Call our office for a nurse visit appointment to get this. The second shot of the series is less severe regarding the side effects, but it still lasts 48 hours.   Let us know if you need anything.

## 2023-10-27 NOTE — Addendum Note (Signed)
 Addended by: Maximino Sarin on: 10/27/2023 09:06 AM   Modules accepted: Orders

## 2023-11-02 ENCOUNTER — Other Ambulatory Visit (INDEPENDENT_AMBULATORY_CARE_PROVIDER_SITE_OTHER)

## 2023-11-02 ENCOUNTER — Encounter: Payer: Self-pay | Admitting: Family Medicine

## 2023-11-02 DIAGNOSIS — Z8639 Personal history of other endocrine, nutritional and metabolic disease: Secondary | ICD-10-CM | POA: Diagnosis not present

## 2023-11-02 DIAGNOSIS — E782 Mixed hyperlipidemia: Secondary | ICD-10-CM

## 2023-11-02 LAB — LIPID PANEL
Cholesterol: 197 mg/dL (ref 0–200)
HDL: 32 mg/dL — ABNORMAL LOW (ref >39.00)
LDL Cholesterol: 132 mg/dL — ABNORMAL HIGH (ref 0–99)
NonHDL: 165.09
Total CHOL/HDL Ratio: 6
Triglycerides: 167 mg/dL — ABNORMAL HIGH (ref 0.0–149.0)
VLDL: 33.4 mg/dL (ref 0.0–40.0)

## 2023-11-02 LAB — COMPREHENSIVE METABOLIC PANEL
ALT: 26 U/L (ref 0–53)
AST: 27 U/L (ref 0–37)
Albumin: 4.5 g/dL (ref 3.5–5.2)
Alkaline Phosphatase: 82 U/L (ref 39–117)
BUN: 12 mg/dL (ref 6–23)
CO2: 30 meq/L (ref 19–32)
Calcium: 9.4 mg/dL (ref 8.4–10.5)
Chloride: 103 meq/L (ref 96–112)
Creatinine, Ser: 0.96 mg/dL (ref 0.40–1.50)
GFR: 92.62 mL/min (ref >60.00)
Glucose, Bld: 107 mg/dL — ABNORMAL HIGH (ref 70–99)
Potassium: 3.8 meq/L (ref 3.5–5.1)
Sodium: 140 meq/L (ref 135–145)
Total Bilirubin: 0.6 mg/dL (ref 0.2–1.2)
Total Protein: 7.1 g/dL (ref 6.0–8.3)

## 2023-11-02 LAB — MAGNESIUM: Magnesium: 2 mg/dL (ref 1.5–2.5)

## 2024-10-26 ENCOUNTER — Encounter: Admitting: Family Medicine
